# Patient Record
Sex: Female | Born: 1950
Health system: Southern US, Community
[De-identification: ages and names within clinical notes are randomized; demographics above are authoritative.]

## PROBLEM LIST (undated history)

## (undated) DIAGNOSIS — E785 Hyperlipidemia, unspecified: Secondary | ICD-10-CM

## (undated) DIAGNOSIS — T7840XA Allergy, unspecified, initial encounter: Secondary | ICD-10-CM

## (undated) DIAGNOSIS — I10 Essential (primary) hypertension: Secondary | ICD-10-CM

## (undated) DIAGNOSIS — D649 Anemia, unspecified: Secondary | ICD-10-CM

## (undated) DIAGNOSIS — E079 Disorder of thyroid, unspecified: Secondary | ICD-10-CM

## (undated) DIAGNOSIS — J309 Allergic rhinitis, unspecified: Secondary | ICD-10-CM

## (undated) HISTORY — PX: COLONOSCOPY: SHX174

## (undated) HISTORY — DX: Hyperlipidemia, unspecified: E78.5

## (undated) HISTORY — DX: Anemia, unspecified: D64.9

## (undated) HISTORY — DX: Allergy, unspecified, initial encounter: T78.40XA

## (undated) HISTORY — DX: Allergic rhinitis, unspecified: J30.9

## (undated) HISTORY — DX: Essential (primary) hypertension: I10

## (undated) HISTORY — PX: UPPER GASTROINTESTINAL ENDOSCOPY: SHX188

## (undated) HISTORY — PX: SPINE SURGERY: SHX786

## (undated) HISTORY — DX: Disorder of thyroid, unspecified: E07.9

---

## 1997-10-23 ENCOUNTER — Other Ambulatory Visit: Admission: RE | Admit: 1997-10-23 | Discharge: 1997-10-23 | Payer: Self-pay | Admitting: Internal Medicine

## 2011-11-11 ENCOUNTER — Ambulatory Visit (INDEPENDENT_AMBULATORY_CARE_PROVIDER_SITE_OTHER): Payer: BC Managed Care – PPO | Admitting: Emergency Medicine

## 2011-11-11 ENCOUNTER — Ambulatory Visit: Payer: BC Managed Care – PPO

## 2011-11-11 DIAGNOSIS — M5410 Radiculopathy, site unspecified: Secondary | ICD-10-CM

## 2011-11-11 DIAGNOSIS — R079 Chest pain, unspecified: Secondary | ICD-10-CM

## 2011-11-11 DIAGNOSIS — M542 Cervicalgia: Secondary | ICD-10-CM

## 2011-11-11 MED ORDER — PREDNISONE 10 MG PO TABS: ORAL_TABLET | ORAL | Status: DC

## 2011-11-11 MED ORDER — HYDROCODONE-ACETAMINOPHEN 5-325 MG PO TABS
1.0000 | ORAL_TABLET | Freq: Four times a day (QID) | ORAL | Status: AC | PRN
Start: 2011-11-11 — End: 2011-11-21

## 2011-11-11 NOTE — Progress Notes (Signed)
  Subjective:    Patient ID: Joan [name], female    DOB: 1951-02-24, 62 y.o.   MRN: [id number]  HPI patient here with onset last night of a sharp shooting pain which starts in her neck and radiates over the anterior portion of her chest. She has a history of neck surgery for disc call she was in the [employer] of this had no problems with her neck for a number of years. She does have some soreness over the posterior portion of the left shoulder but no other symptoms she has not had any true substernal pain she does not feel short of breath she's had no nausea or vomiting associated with this    Review of Systems     Objective:   Physical Exam patient winces at times when she experiences the pain. She is tender over the left superior scapular area she has no weakness of the upper extremities. Her chest is clear to auscultation and percussion. Her cardiac exam is regular rate without murmurs rubs or gallops t   [facility] reading (PRIMARY) by  Dr. Cleta Alberts degenerative changes C3-C4 C5 and C6 are fused       Assessment & Plan:  Patient here with radicular symptoms the her pains are sharp shooting pains lasting only 1-2 seconds. They can occur every 2-3 minutes. I suspect the origin of this pain is her neck. She has had previous neck surgery and this would be the most likely source. X-rays confirm suspicion of a pinched nerve we'll treat with prednisone and pain medication

## 2011-11-11 NOTE — Patient Instructions (Signed)
Cervical Radiculopathy  Cervical radiculopathy means a nerve in the neck is pinched or bruised. This can cause pain or loss of feeling (numbness) that runs from your neck to your arm and fingers.  HOME CARE    Put ice on the injured or painful area.   Put ice in a plastic bag.   Place a towel between your skin and the bag.   Leave the ice on for 15 to 20 minutes, 3 to 4 times a day, or as told by your doctor.   If ice does not help, you can try using heat. Take a warm shower or bath, or use a hot water bottle as told by your doctor.   You may try a gentle neck and shoulder massage.   Use a flat pillow when you sleep.   Only take medicines as told by your doctor.   Keep all physical therapy visits as told by your doctor.   If you are given a soft collar, wear it as told by your doctor.  GET HELP RIGHT AWAY IF:    Your pain gets worse and is not controlled with medicine.   You lose feeling or feel weak in your hand, arm, face, or leg.   You have a fever or stiff neck.   You cannot control when you poop or pee (incontinence).   You have trouble with walking, balance, or speaking.  MAKE SURE YOU:    Understand these instructions.   Will watch your condition.   Will get help right away if you are not doing well or get worse.  Document Released: 06/11/2011 Document Reviewed: 06/08/2011  Exit[employer] Patient Information 2012 [employer].

## 2011-11-18 ENCOUNTER — Ambulatory Visit (INDEPENDENT_AMBULATORY_CARE_PROVIDER_SITE_OTHER): Payer: BC Managed Care – PPO | Admitting: Emergency Medicine

## 2011-11-18 VITALS — BP 116/71 | HR 92 | Temp 98.4°F | Resp 18 | Ht 63.0 in | Wt 182.0 lb

## 2011-11-18 DIAGNOSIS — IMO0002 Reserved for concepts with insufficient information to code with codable children: Secondary | ICD-10-CM

## 2011-11-18 DIAGNOSIS — M542 Cervicalgia: Secondary | ICD-10-CM

## 2011-11-18 NOTE — Progress Notes (Signed)
  Subjective:    Patient ID: Joan [name], female    DOB: 04-30-1951, 61 y.o.   MRN: [id number]  HPI patient enters for followup neck and radicular symptoms. Patient states she is well she has no pain she has no radicular symptoms.    Review of Systems     Objective:   Physical Exam there is full range of motion of the neck deep tendon reflexes are symmetrical motor strength is 5 out of 5 all muscle groups of the upper extremities.        Assessment & Plan:  Assessment is neck pain with cervical radiculopathy resolved. She is to finish out her medications. Noted treatment necessary.

## 2012-10-04 ENCOUNTER — Ambulatory Visit (INDEPENDENT_AMBULATORY_CARE_PROVIDER_SITE_OTHER): Payer: BC Managed Care – PPO | Admitting: Family Medicine

## 2012-10-04 ENCOUNTER — Ambulatory Visit: Payer: BC Managed Care – PPO

## 2012-10-04 VITALS — BP 112/84 | HR 71 | Temp 98.4°F | Resp 18 | Ht 63.0 in | Wt 186.0 lb

## 2012-10-04 DIAGNOSIS — M7050 Other bursitis of knee, unspecified knee: Secondary | ICD-10-CM

## 2012-10-04 DIAGNOSIS — M25561 Pain in right knee: Secondary | ICD-10-CM

## 2012-10-04 DIAGNOSIS — M25569 Pain in unspecified knee: Secondary | ICD-10-CM

## 2012-10-04 MED ORDER — DICLOFENAC SODIUM 75 MG PO TBEC: 75.0000 mg | DELAYED_RELEASE_TABLET | Freq: Two times a day (BID) | ORAL | Status: DC

## 2012-10-04 NOTE — Patient Instructions (Addendum)
Take diclofenac one pill twice daily with food.   I think it is okay to keep exercising, but if it is hurting you stop. Try not to overdo it. Avoid a lot of squatting-type activities.
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
## 2012-10-13 ENCOUNTER — Ambulatory Visit (INDEPENDENT_AMBULATORY_CARE_PROVIDER_SITE_OTHER): Payer: BC Managed Care – PPO | Admitting: Emergency Medicine

## 2012-10-13 VITALS — BP 122/76 | HR 84 | Temp 98.4°F | Resp 16 | Ht 62.5 in | Wt 188.0 lb

## 2012-10-13 DIAGNOSIS — M2390 Unspecified internal derangement of unspecified knee: Secondary | ICD-10-CM

## 2012-10-13 MED ORDER — NAPROXEN SODIUM 550 MG PO TABS: 550.0000 mg | ORAL_TABLET | Freq: Two times a day (BID) | ORAL | Status: DC

## 2012-10-13 NOTE — Patient Instructions (Addendum)
Knee, Cartilage (Meniscus) Injury  It is suspected that you have a torn cartilage (meniscus) in your knee. The menisci are made of tough cartilage, and fit between the surfaces of the thigh and leg bones. The menisci are "C"shaped and have a wedged profile. The wedged profile helps the stability of the joint by keeping the rounded femur surface from sliding off the flat tibial surface. The menisci are fed (nourished) by small blood vessels; but there is also a large area at the inner edge of the meniscus that does not have a good blood supply (avascular). This presents a problem when there is an injury to the meniscus, because areas without good blood supply heal poorly. As a result when there is a torn cartilage in the knee, surgery is often required to fix it. This is usually done with a surgical procedure less invasive than open surgery (arthroscopy). Some times open surgery of the knee is required if there is other damage.  PURPOSE OF THE MENISCUS  The medial meniscus rests on the medial tibial plateau. The tibia is the large bone in your lower leg (the shin bone). The medial tibial plateau is the upper end of the bone making up the inner part of your knee. The lateral meniscus serves the same purpose and is located on the outside of the knee. The menisci help to distribute your body weight across the knee joint; they act as shock absorbers. Without the meniscus present, the weight of your body would be unevenly applied to the bones in your legs (the femur and tibia). The femur is the large bone in your thigh. This uneven weight distribution would cause increased wear and tear on the cartilage lining the joint surfaces, leading to early damage (arthritis) of these areas. The presence of the menisci cartilage is necessary for a healthy knee.  PURPOSE OF THE KNEE CARTILAGE  The knee joint is made up of three bones: the thigh bone (femur), the shin bone (tibia), and the knee cap (patella). The surfaces of these  bones at the knee joint are covered with cartilage called articular cartilage. This smooth slippery surface allows the bones to slide against each other without causing bone damage. The meniscus sits between these cartilaginous surfaces of the bones. It distributes the weight evenly in the joints and helps with the stability of the joint (keeps the joint steady).  HOME CARE INSTRUCTIONS   Use crutches and external braces as instructed.   Once home an ice pack applied to your injured knee may help with discomfort and keep the swelling down. An ice pack can be used for the first couple of days or as instructed.   Only take over-the-counter or prescription medicines for pain, discomfort, or fever as directed by your caregiver.   Call if you do not have relief of pain with medications or if there is increasing in pain.   Call if your foot becomes cold or blue.   You may resume normal diet and activities as directed.   Make sure to keep your appointment with your follow-up caregiver. This injury may require further evaluation and treatment beyond the temporary treatment given today.  Document Released: 09/12/2002 Document Revised: 09/14/2011 Document Reviewed: 01/04/2009  Exit[employer] Patient Information 2013 [employer].

## 2012-10-13 NOTE — Progress Notes (Signed)
[facility] [address] [place] [address] [phone]  Date:  10/13/2012   Name:  Joan [name]   DOB:  March 04, 1951   MRN:  [id number]  PCP:  No primary provider on file.    Chief Complaint: Knee Pain   History of Present Illness:  Joan [name] is a 62 y.o. very pleasant female patient who presents with the following:  No history of injury.  Says that her right knee has been swollen and painful.  Not able to bear weight without pain.  Here previously and to [place] and seen.  No improvement with OTC medication. No improvement with over the counter medications or other home remedies. Denies other complaint or health concern today.   There is no problem list on file for this patient.   Past Medical History  Diagnosis Date  . Hypertension   . Hyperlipidemia   . Thyroid disease     Past Surgical History  Procedure Laterality Date  . Spine surgery      History  Substance Use Topics  . Smoking status: Never Smoker   . Smokeless tobacco: Not on file  . Alcohol Use: No    No family history on file.  Allergies  Allergen Reactions  . Penicillins     Medication list has been reviewed and updated.  Current Outpatient Prescriptions on File Prior to Visit  Medication Sig Dispense Refill  . hydrochlorothiazide (MICROZIDE) 12.5 MG capsule Take 12.5 mg by mouth daily.      Marland Kitchen levothyroxine (SYNTHROID, LEVOTHROID) 137 MCG tablet Take 137 mcg by mouth daily.      . pravastatin (PRAVACHOL) 20 MG tablet Take 20 mg by mouth daily.      . diclofenac (VOLTAREN) 75 MG EC tablet Take 1 tablet (75 mg total) by mouth 2 (two) times daily.  30 tablet  1   No current facility-administered medications on file prior to visit.    Review of Systems:  As per HPI, otherwise negative.    Physical Examination: Filed Vitals:   10/13/12 1235  BP: 122/76  Pulse: 84  Temp: 98.4 F (36.9 C)  Resp: 16   Filed Vitals:   10/13/12 1235  Height: 5' 2.5" (1.588  m)  Weight: 188 lb (85.276 kg)   Body mass index is 33.82 kg/(m^2). Ideal Body Weight: Weight in (lb) to have BMI = 25: 138.6   GEN: WDWN, NAD, Non-toxic, Alert & Oriented x 3 HEENT: Atraumatic, Normocephalic.  Ears and Nose: No external deformity. EXTR: No clubbing/cyanosis/edema NEURO: Normal gait.  PSYCH: Normally interactive. Conversant. Not depressed or anxious appearing.  Calm demeanor.  Knee tender medially. Suggestive of meniscus tear.  Assessment and Plan: Meniscus tear MRI Follow up as needed   Signed,  Pillars Odor, MD

## 2012-10-27 ENCOUNTER — Ambulatory Visit
Admission: RE | Admit: 2012-10-27 | Discharge: 2012-10-27 | Disposition: A | Discharge status: Home / Self Care | Payer: BC Managed Care – PPO | Source: Ambulatory Visit | Attending: Emergency Medicine | Admitting: Emergency Medicine

## 2012-10-27 DIAGNOSIS — M2390 Unspecified internal derangement of unspecified knee: Secondary | ICD-10-CM

## 2012-10-27 NOTE — Addendum Note (Signed)
Addended by: Carmelina Dane on: 10/27/2012 07:26 PM   Modules accepted: Orders

## 2012-11-01 ENCOUNTER — Telehealth: Payer: Self-pay | Admitting: Radiology

## 2012-11-01 NOTE — Telephone Encounter (Signed)
Pt is calling back because she had a missed call and she believe it could be about her last visit Call back number is [phone]

## 2012-11-02 NOTE — Telephone Encounter (Signed)
Was in regards to her MRI scan. Called her. Report in the results folder.

## 2012-11-02 NOTE — Telephone Encounter (Signed)
Pt CB and I gave her Dr Ewell Poe note on MRI results. She stated she has seen ortho and is going back for f/up after MRI on 11/04/12.

## 2012-11-11 ENCOUNTER — Encounter: Payer: Self-pay | Admitting: *Deleted

## 2012-12-30 ENCOUNTER — Ambulatory Visit (INDEPENDENT_AMBULATORY_CARE_PROVIDER_SITE_OTHER): Payer: BC Managed Care – PPO | Admitting: Physician Assistant

## 2012-12-30 ENCOUNTER — Telehealth: Payer: Self-pay

## 2012-12-30 DIAGNOSIS — T6391XA Toxic effect of contact with unspecified venomous animal, accidental (unintentional), initial encounter: Secondary | ICD-10-CM

## 2012-12-30 DIAGNOSIS — L299 Pruritus, unspecified: Secondary | ICD-10-CM

## 2012-12-30 DIAGNOSIS — I10 Essential (primary) hypertension: Secondary | ICD-10-CM | POA: Insufficient documentation

## 2012-12-30 DIAGNOSIS — E039 Hypothyroidism, unspecified: Secondary | ICD-10-CM | POA: Insufficient documentation

## 2012-12-30 DIAGNOSIS — E7800 Pure hypercholesterolemia, unspecified: Secondary | ICD-10-CM | POA: Insufficient documentation

## 2012-12-30 MED ORDER — CETIRIZINE HCL 10 MG PO TABS: 10.0000 mg | ORAL_TABLET | Freq: Every day | ORAL | Status: DC

## 2012-12-30 MED ORDER — RANITIDINE HCL 300 MG PO TABS: 300.0000 mg | ORAL_TABLET | Freq: Every day | ORAL | Status: DC

## 2012-12-30 NOTE — Telephone Encounter (Signed)
Joan [name]   Patient took the medication and is doing much better.  Thank you so much.

## 2012-12-30 NOTE — Progress Notes (Signed)
   [address] [place] [address]   Phone [phone]  Subjective:    Patient ID: Joan [name], female    DOB: 07-24-1950, 62 y.o.   MRN: [id number]  HPI  Pt presents to clinic with a bee sting to her L leg above the posterior fossa of the knee.  She was gardening and felt a sting to the area and this am it was red and swollen and warm to the touch.  She has used no meds for it and done nothing to the area.   Review of Systems  Constitutional: Negative for fever and chills.  Skin: Positive for rash.       Objective:   Physical Exam  Vitals reviewed. Constitutional: She is oriented to person, place, and time. She appears well-developed and well-nourished.  HENT:  Head: Normocephalic and atraumatic.  Right Ear: External ear normal.  Left Ear: External ear normal.  Eyes: Conjunctivae are normal.  Neck: Normal range of motion.  Pulmonary/Chest: Effort normal.  Neurological: She is alert and oriented to person, place, and time.  Skin: Skin is warm and dry. Rash noted. Rash is urticarial (indurated area that is warm to the touch - marked with sharpie - ~6-8 cm in size).  Psychiatric: She has a normal mood and affect. Her behavior is normal. Judgment and thought content normal.          Assessment & Plan:  Bee sting, initial encounter - local allergic reaction pt will also add benadryl today and ice - if the area gets worse - RTC- Plan: cetirizine (ZYRTEC) 10 MG tablet, ranitidine (ZANTAC) 300 MG tablet  Itching - Plan: cetirizine (ZYRTEC) 10 MG tablet, ranitidine (ZANTAC) 300 MG tablet  Benny Lennert PA-C 12/30/2012 8:39 AM

## 2012-12-30 NOTE — Telephone Encounter (Signed)
Great!

## 2013-01-12 ENCOUNTER — Ambulatory Visit (INDEPENDENT_AMBULATORY_CARE_PROVIDER_SITE_OTHER): Payer: BC Managed Care – PPO | Admitting: Physician Assistant

## 2013-01-12 VITALS — BP 144/84 | HR 79 | Temp 98.1°F | Resp 18 | Ht 64.0 in | Wt 193.0 lb

## 2013-01-12 DIAGNOSIS — R05 Cough: Secondary | ICD-10-CM

## 2013-01-12 DIAGNOSIS — J310 Chronic rhinitis: Secondary | ICD-10-CM

## 2013-01-12 DIAGNOSIS — J029 Acute pharyngitis, unspecified: Secondary | ICD-10-CM

## 2013-01-12 LAB — POCT RAPID STREP A (OFFICE): Rapid Strep A Screen: NEGATIVE

## 2013-01-12 MED ORDER — BENZONATATE 100 MG PO CAPS: 100.0000 mg | ORAL_CAPSULE | Freq: Three times a day (TID) | ORAL | Status: DC | PRN

## 2013-01-12 MED ORDER — GUAIFENESIN ER 1200 MG PO TB12: 1.0000 | ORAL_TABLET | Freq: Two times a day (BID) | ORAL | Status: DC | PRN

## 2013-01-12 MED ORDER — IPRATROPIUM BROMIDE 0.03 % NA SOLN: 2.0000 | Freq: Two times a day (BID) | NASAL | Status: DC

## 2013-01-12 NOTE — Progress Notes (Signed)
I have examined this patient along with the student and agree.  

## 2013-01-12 NOTE — Progress Notes (Signed)
  Subjective:    Patient ID: Joan [name], female    DOB: 26-Oct-1950, 62 y.o.   MRN: [id number]  HPI  Presents with sore throat, ear pain x 1.5 weeks, cough & loss of voice x 1 day. Pt reports feeling fine, recovering from R knee meniscus surgery. She went to visit cousin who is a chain smoker and keeps the house very hot for 2 days. When she returned home she developed a sore throat, painful swallowing, throat clearing and L ear pain. Throat pain is worse on L, feels "raw". Cough and loss of voice appeared this morning. Cough is productive with green/yellow sputum. States she blew her nose this morning and instantly felt very dizzy. Complains of mild headache. Denies rhinorrhea, sinus tenderness or nasal congestion. Denies fever.   Review of Systems Denies: SOB, chest pain, abdominal pain, urinary symptoms     Objective:   Physical Exam  BP 144/84  Pulse 79  Temp(Src) 98.1 F (36.7 C) (Oral)  Resp 18  Ht 5\' 4"  (1.626 m)  Wt 193 lb (87.544 kg)  BMI 33.11 kg/m2  SpO2 98%  General: WDWN female, appears stated age, NAD, loss of voice Head: normocephalic, atraumatic, no sinus tenderness Eyes: PERRLA, sclera/conjunctiva clear Ears: TMs clear with visible bony landmarks Nose: turbinates normal, congestion L side Throat: moist mucous membranes, uvula midline, posterior pharynx is erythematous with mild edema no exudate  Neck: supple, no palpable lymphadenopathy  Resp: good air entry, CTA bilaterally no rhonchi rales wheezes Cardiac: RRR, S1S2 appreciated, no murmurs rubs gallops  Neuro: A&O x 3  Skin: no rashes lesions   Results for orders placed in visit on 01/12/13  POCT RAPID STREP A (OFFICE)      Result Value Range   Rapid Strep A Screen Negative  Negative       Assessment & Plan:  Acute pharyngitis - Plan: POCT rapid strep A, Culture, Group A Strep  Cough  Rhinitis  Suspect post nasal drip is causing throat irritation and cough, sinus congestion is causing ear pain.   RTC in 1 week if no improvement.   Meds ordered this encounter  Medications  . benzonatate (TESSALON) 100 MG capsule    Sig: Take 1-2 capsules (100-200 mg total) by mouth 3 (three) times daily as needed for cough.    Dispense:  40 capsule    Refill:  0    Order Specific Question:  Supervising Provider    Answer:  DOOLITTLE, ROBERT P [[id number]]  . ipratropium (ATROVENT) 0.03 % nasal spray    Sig: Place 2 sprays into the nose 2 (two) times daily.    Dispense:  30 mL    Refill:  0    Order Specific Question:  Supervising Provider    Answer:  DOOLITTLE, ROBERT P [[id number]]  . Guaifenesin (MUCINEX MAXIMUM STRENGTH) 1200 MG TB12    Sig: Take 1 tablet (1,200 mg total) by mouth every 12 (twelve) hours as needed.    Dispense:  14 tablet    Refill:  1    Order Specific Question:  Supervising Provider    Answer:  DOOLITTLE, ROBERT P [[id number]]

## 2013-01-12 NOTE — Patient Instructions (Signed)
Get plenty of rest and drink at least 64 ounces of water daily. If your symptoms have not improved in the next 7 days, please contact the office.

## 2014-04-03 DIAGNOSIS — Z23 Encounter for immunization: Secondary | ICD-10-CM | POA: Diagnosis not present

## 2014-08-07 ENCOUNTER — Ambulatory Visit (INDEPENDENT_AMBULATORY_CARE_PROVIDER_SITE_OTHER): Payer: Medicare Other | Admitting: Family Medicine

## 2014-08-07 ENCOUNTER — Ambulatory Visit (INDEPENDENT_AMBULATORY_CARE_PROVIDER_SITE_OTHER): Payer: BC Managed Care – PPO

## 2014-08-07 VITALS — BP 122/80 | HR 79 | Temp 98.3°F | Resp 18 | Ht 63.5 in | Wt 192.0 lb

## 2014-08-07 DIAGNOSIS — S93402A Sprain of unspecified ligament of left ankle, initial encounter: Secondary | ICD-10-CM

## 2014-08-07 DIAGNOSIS — M79672 Pain in left foot: Secondary | ICD-10-CM

## 2014-08-07 NOTE — Progress Notes (Addendum)
Subjective:  This chart was scribed for Merri Ray MD,  by Tamsen Roers, at [facility].  This patient was seen in room 12 and the patient's care was started at 9:40 AM.     Patient ID: Joan [name], female    DOB: 03/03/1951, 64 y.o.   MRN: [id number]  HPI  HPI Comments: Joan [name] is a 64 y.o. female who presents to [facility] for left ankle pain which started two days ago. Patient was wearing wedges and as she was going down the steps and had a left foot inversion. Right after the incident, she had no trouble walking but an hour later, she notes she had pain and needed help with ambulation. The last two days she has been icing her foot and elevating it. Patient notes of no other complaints today.      Patient Active Problem List   Diagnosis Date Noted  . HTN (hypertension) 12/30/2012  . Pure hypercholesterolemia 12/30/2012  . Unspecified hypothyroidism 12/30/2012   Past Medical History  Diagnosis Date  . Hypertension   . Hyperlipidemia   . Thyroid disease   . Allergy    Past Surgical History  Procedure Laterality Date  . Spine surgery     Allergies  Allergen Reactions  . Penicillins Hives   Prior to Admission medications   Medication Sig Start Date End Date Taking? Authorizing Provider  hydrochlorothiazide (MICROZIDE) 12.5 MG capsule Take 12.5 mg by mouth daily.   Yes Historical Provider, MD  levothyroxine (SYNTHROID, LEVOTHROID) 137 MCG tablet Take 137 mcg by mouth daily.   Yes Historical Provider, MD  benzonatate (TESSALON) 100 MG capsule Take 1-2 capsules (100-200 mg total) by mouth 3 (three) times daily as needed for cough. Patient not taking: Reported on 08/07/2014 01/12/13   Chelle S Jeffery, PA-C  cetirizine (ZYRTEC) 10 MG tablet Take 1 tablet (10 mg total) by mouth daily. Patient not taking: Reported on 08/07/2014 12/30/12   Mancel Bale, PA-C  Guaifenesin [facility] MAXIMUM STRENGTH) 1200 MG TB12 Take 1  tablet (1,200 mg total) by mouth every 12 (twelve) hours as needed. Patient not taking: Reported on 08/07/2014 01/12/13   Chelle S Jeffery, PA-C  ipratropium (ATROVENT) 0.03 % nasal spray Place 2 sprays into the nose 2 (two) times daily. Patient not taking: Reported on 08/07/2014 01/12/13   Chelle S Jeffery, PA-C  pravastatin (PRAVACHOL) 20 MG tablet Take 20 mg by mouth daily.    Historical Provider, MD  ranitidine (ZANTAC) 300 MG tablet Take 1 tablet (300 mg total) by mouth at bedtime. Patient not taking: Reported on 08/07/2014 12/30/12   Mancel Bale, PA-C   History   Social History  . Marital Status: Married    Spouse Name: N/A    Number of Children: N/A  . Years of Education: N/A   Occupational History  . Not on file.   Social History Main Topics  . Smoking status: Never Smoker   . Smokeless tobacco: Not on file  . Alcohol Use: No  . Drug Use: No  . Sexual Activity: Not on file   Other Topics Concern  . Not on file   Social History Narrative        Review of Systems  Constitutional: Negative for fever.  Musculoskeletal: Positive for myalgias.  Skin: Negative for color change.       Objective:   Physical Exam  Constitutional: She is oriented to person, place, and time. She appears well-developed and  well-nourished. No distress.  HENT:  [name]: Normocephalic and atraumatic.  Eyes: Conjunctivae and EOM are normal.  Neck: Neck supple.  Cardiovascular: Normal rate.   Pulmonary/Chest: Effort normal. No respiratory distress.  Musculoskeletal: Normal range of motion.  Left knee: full ROM, no tenderness specifically none at proximal fibula.   Left ankle: Negative squeeze. negative Kleiger  achilles is non tender.  Normal ROM.   Ankle is non tender specifically the  malleoli  There is some soft tissue swelling over the ATF but non tender.     left foot: some tenderness along the proxial  fifth metatarsal but overlying skin is not swollen no ecchymosis.    remainder of  foot is non tender.  Negative drawer and negative talar tilt  Neurological: She is alert and oriented to person, place, and time.  Skin: Skin is warm and dry.  Psychiatric: She has a normal mood and affect. Her behavior is normal.  Nursing note and vitals reviewed.   Filed Vitals:   08/07/14 0910  BP: 122/80  Pulse: 79  Temp: 98.3 F (36.8 C)  TempSrc: Oral  Resp: 18  Height: 5' 3.5" (1.613 m)  Weight: 192 lb (87.091 kg)  SpO2: 98%     [facility] (PRIMARY) x-ray report read by Dr.Satsuki Zillmer  left foot:  No apparent fracture or acute findings.      Assessment & Plan:   Joan [name] is a 64 y.o. female Left foot pain - Plan: DG Foot Complete Left  Left ankle sprain, initial encounter - Plan: DG Foot Complete Left  Mild/grade 1 sprain, lateral left ankle. Lateral foot pain may be from peroneal insertion, but no fracture seen and FROM.   -sweedo brace fitted, ankle rom and HEP by handout, rtc precautions discussed.   No orders of the defined types were placed in this encounter.   Patient Instructions  Your xray did not appear to have a fracture.  You can wear the wrap up ankle brace as needed, but come out of it for stretches and range of motion.  Ok to walk for exercise, and other rehab exercises below.  Return to the clinic or go to the nearest emergency room if any of your symptoms worsen or new symptoms occur.  Acute Ankle Sprain with Phase I Rehab An acute ankle sprain is a partial or complete tear in one or more of the ligaments of the ankle due to traumatic injury. The severity of the injury depends on both the number of ligaments sprained and the grade of sprain. There are 3 grades of sprains.   A grade 1 sprain is a mild sprain. There is a slight pull without obvious tearing. There is no loss of strength, and the muscle and ligament are the correct length.  A grade 2 sprain is a moderate sprain. There is tearing of fibers within the substance of the ligament where it  connects two bones or two cartilages. The length of the ligament is increased, and there is usually decreased strength.  A grade 3 sprain is a complete rupture of the ligament and is uncommon. In addition to the grade of sprain, there are three types of ankle sprains.  Lateral ankle sprains: This is a sprain of one or more of the three ligaments on the outer side (lateral) of the ankle. These are the most common sprains. Medial ankle sprains: There is one large triangular ligament of the inner side (medial) of the ankle that is susceptible to injury. Medial ankle sprains are  less common. Syndesmosis, "high ankle," sprains: The syndesmosis is the ligament that connects the two bones of the lower leg. Syndesmosis sprains usually only occur with very severe ankle sprains. SYMPTOMS  Pain, tenderness, and swelling in the ankle, starting at the side of injury that may progress to the whole ankle and foot with time.  "Pop" or tearing sensation at the time of injury.  Bruising that may spread to the heel.  Impaired ability to walk soon after injury. CAUSES   Acute ankle sprains are caused by trauma placed on the ankle that temporarily forces or pries the anklebone (talus) out of its normal socket.  Stretching or tearing of the ligaments that normally hold the joint in place (usually due to a twisting injury). RISK INCREASES WITH:  Previous ankle sprain.  Sports in which the foot may land awkwardly (i.e., basketball, volleyball, or soccer) or walking or running on uneven or rough surfaces.  Shoes with inadequate support to prevent sideways motion when stress occurs.  Poor strength and flexibility.  Poor balance skills.  Contact sports. PREVENTION   Warm up and stretch properly before activity.  Maintain physical fitness:  Ankle and leg flexibility, muscle strength, and endurance.  Cardiovascular fitness.  Balance training activities.  Use proper technique and have a coach correct  improper technique.  Taping, protective strapping, bracing, or high-top tennis shoes may help prevent injury. Initially, tape is best; however, it loses most of its support function within 10 to 15 minutes.  Wear proper-fitted protective shoes (High-top shoes with taping or bracing is more effective than either alone).  Provide the ankle with support during sports and practice activities for 12 months following injury. PROGNOSIS   If treated properly, ankle sprains can be expected to recover completely; however, the length of recovery depends on the degree of injury.  A grade 1 sprain usually heals enough in 5 to 7 days to allow modified activity and requires an average of 6 weeks to heal completely.  A grade 2 sprain requires 6 to 10 weeks to heal completely.  A grade 3 sprain requires 12 to 16 weeks to heal.  A syndesmosis sprain often takes more than 3 months to heal. RELATED COMPLICATIONS   Frequent recurrence of symptoms may result in a chronic problem. Appropriately addressing the problem the first time decreases the frequency of recurrence and optimizes healing time. Severity of the initial sprain does not predict the likelihood of later instability.  Injury to other structures (bone, cartilage, or tendon).  A chronically unstable or arthritic ankle joint is a possibility with repeated sprains. TREATMENT Treatment initially involves the use of ice, medication, and compression bandages to help reduce pain and inflammation. Ankle sprains are usually immobilized in a walking cast or boot to allow for healing. Crutches may be recommended to reduce pressure on the injury. After immobilization, strengthening and stretching exercises may be necessary to regain strength and a full range of motion. Surgery is rarely needed to treat ankle sprains. MEDICATION   Nonsteroidal anti-inflammatory medications, such as aspirin and ibuprofen (do not take for the first 3 days after injury or within 7  days before surgery), or other minor pain relievers, such as acetaminophen, are often recommended. Take these as directed by your caregiver. Contact your caregiver immediately if any bleeding, stomach upset, or signs of an allergic reaction occur from these medications.  Ointments applied to the skin may be helpful.  Pain relievers may be prescribed as necessary by your caregiver. Do not take  prescription pain medication for longer than 4 to 7 days. Use only as directed and only as much as you need. HEAT AND COLD  Cold treatment (icing) is used to relieve pain and reduce inflammation for acute and chronic cases. Cold should be applied for 10 to 15 minutes every 2 to 3 hours for inflammation and pain and immediately after any activity that aggravates your symptoms. Use ice packs or an ice massage.  Heat treatment may be used before performing stretching and strengthening activities prescribed by your caregiver. Use a heat pack or a warm soak. SEEK IMMEDIATE MEDICAL CARE IF:   Pain, swelling, or bruising worsens despite treatment.  You experience pain, numbness, discoloration, or coldness in the foot or toes.  New, unexplained symptoms develop (drugs used in treatment may produce side effects.) EXERCISES  PHASE I EXERCISES RANGE OF MOTION (ROM) AND STRETCHING EXERCISES - Ankle Sprain, Acute Phase I, Weeks 1 to 2 These exercises may help you when beginning to restore flexibility in your ankle. You will likely work on these exercises for the 1 to 2 weeks after your injury. Once your physician, physical therapist, or athletic trainer sees adequate progress, he or she will advance your exercises. While completing these exercises, remember:   Restoring tissue flexibility helps normal motion to return to the joints. This allows healthier, less painful movement and activity.  An effective stretch should be held for at least 30 seconds.  A stretch should never be painful. You should only feel a  gentle lengthening or release in the stretched tissue. RANGE OF MOTION - Dorsi/Plantar Flexion  While sitting with your right / left knee straight, draw the top of your foot upwards by flexing your ankle. Then reverse the motion, pointing your toes downward.  Hold each position for __________ seconds.  After completing your first set of exercises, repeat this exercise with your knee bent. Repeat __________ times. Complete this exercise __________ times per day.  RANGE OF MOTION - Ankle Alphabet  Imagine your right / left big toe is a pen.  Keeping your hip and knee still, write out the entire alphabet with your "pen." Make the letters as large as you can without increasing any discomfort. Repeat __________ times. Complete this exercise __________ times per day.  STRENGTHENING EXERCISES - Ankle Sprain, Acute -Phase I, Weeks 1 to 2 These exercises may help you when beginning to restore strength in your ankle. You will likely work on these exercises for 1 to 2 weeks after your injury. Once your physician, physical therapist, or athletic trainer sees adequate progress, he or she will advance your exercises. While completing these exercises, remember:   Muscles can gain both the endurance and the strength needed for everyday activities through controlled exercises.  Complete these exercises as instructed by your physician, physical therapist, or athletic trainer. Progress the resistance and repetitions only as guided.  You may experience muscle soreness or fatigue, but the pain or discomfort you are trying to eliminate should never worsen during these exercises. If this pain does worsen, stop and make certain you are following the directions exactly. If the pain is still present after adjustments, discontinue the exercise until you can discuss the trouble with your clinician. STRENGTH - Dorsiflexors  Secure a rubber exercise band/tubing to a fixed object (i.e., table, pole) and loop the other end  around your right / left foot.  Sit on the floor facing the fixed object. The band/tubing should be slightly tense when your foot is relaxed.  Slowly draw your foot back toward you using your ankle and toes.  Hold this position for __________ seconds. Slowly release the tension in the band and return your foot to the starting position. Repeat __________ times. Complete this exercise __________ times per day.  STRENGTH - Plantar-flexors   Sit with your right / left leg extended. Holding onto both ends of a rubber exercise band/tubing, loop it around the ball of your foot. Keep a slight tension in the band.  Slowly push your toes away from you, pointing them downward.  Hold this position for __________ seconds. Return slowly, controlling the tension in the band/tubing. Repeat __________ times. Complete this exercise __________ times per day.  STRENGTH - Ankle Eversion  Secure one end of a rubber exercise band/tubing to a fixed object (table, pole). Loop the other end around your foot just before your toes.  Place your fists between your knees. This will focus your strengthening at your ankle.  Drawing the band/tubing across your opposite foot, slowly, pull your little toe out and up. Make sure the band/tubing is positioned to resist the entire motion.  Hold this position for __________ seconds. Have your muscles resist the band/tubing as it slowly pulls your foot back to the starting position.  Repeat __________ times. Complete this exercise __________ times per day.  STRENGTH - Ankle Inversion  Secure one end of a rubber exercise band/tubing to a fixed object (table, pole). Loop the other end around your foot just before your toes.  Place your fists between your knees. This will focus your strengthening at your ankle.  Slowly, pull your big toe up and in, making sure the band/tubing is positioned to resist the entire motion.  Hold this position for __________ seconds.  Have your  muscles resist the band/tubing as it slowly pulls your foot back to the starting position. Repeat __________ times. Complete this exercises __________ times per day.  STRENGTH - Towel Curls  Sit in a chair positioned on a non-carpeted surface.  Place your right / left foot on a towel, keeping your heel on the floor.  Pull the towel toward your heel by only curling your toes. Keep your heel on the floor.  If instructed by your physician, physical therapist, or athletic trainer, add weight to the end of the towel. Repeat __________ times. Complete this exercise __________ times per day. Document Released: 01/21/2005 Document Revised: 11/06/2013 Document Reviewed: 10/04/2008 [facility] Patient Information 2015 [place], [place]. This information is not intended to replace advice given to you by your health care provider. Make sure you discuss any questions you have with your health care provider.     I personally performed the services described in this documentation, which was scribed in my presence. The recorded information has been reviewed and considered, and addended by me as needed.

## 2014-08-07 NOTE — Patient Instructions (Signed)
Your xray did not appear to have a fracture.  You can wear the wrap up ankle brace as needed, but come out of it for stretches and range of motion.  Ok to walk for exercise, and other rehab exercises below.  Return to the clinic or go to the nearest emergency room if any of your symptoms worsen or new symptoms occur.  Acute Ankle Sprain with Phase I Rehab An acute ankle sprain is a partial or complete tear in one or more of the ligaments of the ankle due to traumatic injury. The severity of the injury depends on both the number of ligaments sprained and the grade of sprain. There are 3 grades of sprains.   A grade 1 sprain is a mild sprain. There is a slight pull without obvious tearing. There is no loss of strength, and the muscle and ligament are the correct length.  A grade 2 sprain is a moderate sprain. There is tearing of fibers within the substance of the ligament where it connects two bones or two cartilages. The length of the ligament is increased, and there is usually decreased strength.  A grade 3 sprain is a complete rupture of the ligament and is uncommon. In addition to the grade of sprain, there are three types of ankle sprains.  Lateral ankle sprains: This is a sprain of one or more of the three ligaments on the outer side (lateral) of the ankle. These are the most common sprains. Medial ankle sprains: There is one large triangular ligament of the inner side (medial) of the ankle that is susceptible to injury. Medial ankle sprains are less common. Syndesmosis, "high ankle," sprains: The syndesmosis is the ligament that connects the two bones of the lower leg. Syndesmosis sprains usually only occur with very severe ankle sprains. SYMPTOMS  Pain, tenderness, and swelling in the ankle, starting at the side of injury that may progress to the whole ankle and foot with time.  "Pop" or tearing sensation at the time of injury.  Bruising that may spread to the heel.  Impaired ability  to walk soon after injury. CAUSES   Acute ankle sprains are caused by trauma placed on the ankle that temporarily forces or pries the anklebone (talus) out of its normal socket.  Stretching or tearing of the ligaments that normally hold the joint in place (usually due to a twisting injury). RISK INCREASES WITH:  Previous ankle sprain.  Sports in which the foot may land awkwardly (i.e., basketball, volleyball, or soccer) or walking or running on uneven or rough surfaces.  Shoes with inadequate support to prevent sideways motion when stress occurs.  Poor strength and flexibility.  Poor balance skills.  Contact sports. PREVENTION   Warm up and stretch properly before activity.  Maintain physical fitness:  Ankle and leg flexibility, muscle strength, and endurance.  Cardiovascular fitness.  Balance training activities.  Use proper technique and have a coach correct improper technique.  Taping, protective strapping, bracing, or high-top tennis shoes may help prevent injury. Initially, tape is best; however, it loses most of its support function within 10 to 15 minutes.  Wear proper-fitted protective shoes (High-top shoes with taping or bracing is more effective than either alone).  Provide the ankle with support during sports and practice activities for 12 months following injury. PROGNOSIS   If treated properly, ankle sprains can be expected to recover completely; however, the length of recovery depends on the degree of injury.  A grade 1 sprain usually heals enough  in 5 to 7 days to allow modified activity and requires an average of 6 weeks to heal completely.  A grade 2 sprain requires 6 to 10 weeks to heal completely.  A grade 3 sprain requires 12 to 16 weeks to heal.  A syndesmosis sprain often takes more than 3 months to heal. RELATED COMPLICATIONS   Frequent recurrence of symptoms may result in a chronic problem. Appropriately addressing the problem the first time  decreases the frequency of recurrence and optimizes healing time. Severity of the initial sprain does not predict the likelihood of later instability.  Injury to other structures (bone, cartilage, or tendon).  A chronically unstable or arthritic ankle joint is a possibility with repeated sprains. TREATMENT Treatment initially involves the use of ice, medication, and compression bandages to help reduce pain and inflammation. Ankle sprains are usually immobilized in a walking cast or boot to allow for healing. Crutches may be recommended to reduce pressure on the injury. After immobilization, strengthening and stretching exercises may be necessary to regain strength and a full range of motion. Surgery is rarely needed to treat ankle sprains. MEDICATION   Nonsteroidal anti-inflammatory medications, such as aspirin and ibuprofen (do not take for the first 3 days after injury or within 7 days before surgery), or other minor pain relievers, such as acetaminophen, are often recommended. Take these as directed by your caregiver. Contact your caregiver immediately if any bleeding, stomach upset, or signs of an allergic reaction occur from these medications.  Ointments applied to the skin may be helpful.  Pain relievers may be prescribed as necessary by your caregiver. Do not take prescription pain medication for longer than 4 to 7 days. Use only as directed and only as much as you need. HEAT AND COLD  Cold treatment (icing) is used to relieve pain and reduce inflammation for acute and chronic cases. Cold should be applied for 10 to 15 minutes every 2 to 3 hours for inflammation and pain and immediately after any activity that aggravates your symptoms. Use ice packs or an ice massage.  Heat treatment may be used before performing stretching and strengthening activities prescribed by your caregiver. Use a heat pack or a warm soak. SEEK IMMEDIATE MEDICAL CARE IF:   Pain, swelling, or bruising worsens  despite treatment.  You experience pain, numbness, discoloration, or coldness in the foot or toes.  New, unexplained symptoms develop (drugs used in treatment may produce side effects.) EXERCISES  PHASE I EXERCISES RANGE OF MOTION (ROM) AND STRETCHING EXERCISES - Ankle Sprain, Acute Phase I, Weeks 1 to 2 These exercises may help you when beginning to restore flexibility in your ankle. You will likely work on these exercises for the 1 to 2 weeks after your injury. Once your physician, physical therapist, or athletic trainer sees adequate progress, he or she will advance your exercises. While completing these exercises, remember:   Restoring tissue flexibility helps normal motion to return to the joints. This allows healthier, less painful movement and activity.  An effective stretch should be held for at least 30 seconds.  A stretch should never be painful. You should only feel a gentle lengthening or release in the stretched tissue. RANGE OF MOTION - Dorsi/Plantar Flexion  While sitting with your right / left knee straight, draw the top of your foot upwards by flexing your ankle. Then reverse the motion, pointing your toes downward.  Hold each position for __________ seconds.  After completing your first set of exercises, repeat this exercise with  your knee bent. Repeat __________ times. Complete this exercise __________ times per day.  RANGE OF MOTION - Ankle Alphabet  Imagine your right / left big toe is a pen.  Keeping your hip and knee still, write out the entire alphabet with your "pen." Make the letters as large as you can without increasing any discomfort. Repeat __________ times. Complete this exercise __________ times per day.  STRENGTHENING EXERCISES - Ankle Sprain, Acute -Phase I, Weeks 1 to 2 These exercises may help you when beginning to restore strength in your ankle. You will likely work on these exercises for 1 to 2 weeks after your injury. Once your physician, physical  therapist, or athletic trainer sees adequate progress, he or she will advance your exercises. While completing these exercises, remember:   Muscles can gain both the endurance and the strength needed for everyday activities through controlled exercises.  Complete these exercises as instructed by your physician, physical therapist, or athletic trainer. Progress the resistance and repetitions only as guided.  You may experience muscle soreness or fatigue, but the pain or discomfort you are trying to eliminate should never worsen during these exercises. If this pain does worsen, stop and make certain you are following the directions exactly. If the pain is still present after adjustments, discontinue the exercise until you can discuss the trouble with your clinician. STRENGTH - Dorsiflexors  Secure a rubber exercise band/tubing to a fixed object (i.e., table, pole) and loop the other end around your right / left foot.  Sit on the floor facing the fixed object. The band/tubing should be slightly tense when your foot is relaxed.  Slowly draw your foot back toward you using your ankle and toes.  Hold this position for __________ seconds. Slowly release the tension in the band and return your foot to the starting position. Repeat __________ times. Complete this exercise __________ times per day.  STRENGTH - Plantar-flexors   Sit with your right / left leg extended. Holding onto both ends of a rubber exercise band/tubing, loop it around the ball of your foot. Keep a slight tension in the band.  Slowly push your toes away from you, pointing them downward.  Hold this position for __________ seconds. Return slowly, controlling the tension in the band/tubing. Repeat __________ times. Complete this exercise __________ times per day.  STRENGTH - Ankle Eversion  Secure one end of a rubber exercise band/tubing to a fixed object (table, pole). Loop the other end around your foot just before your  toes.  Place your fists between your knees. This will focus your strengthening at your ankle.  Drawing the band/tubing across your opposite foot, slowly, pull your little toe out and up. Make sure the band/tubing is positioned to resist the entire motion.  Hold this position for __________ seconds. Have your muscles resist the band/tubing as it slowly pulls your foot back to the starting position.  Repeat __________ times. Complete this exercise __________ times per day.  STRENGTH - Ankle Inversion  Secure one end of a rubber exercise band/tubing to a fixed object (table, pole). Loop the other end around your foot just before your toes.  Place your fists between your knees. This will focus your strengthening at your ankle.  Slowly, pull your big toe up and in, making sure the band/tubing is positioned to resist the entire motion.  Hold this position for __________ seconds.  Have your muscles resist the band/tubing as it slowly pulls your foot back to the starting position. Repeat __________  times. Complete this exercises __________ times per day.  STRENGTH - Towel Curls  Sit in a chair positioned on a non-carpeted surface.  Place your right / left foot on a towel, keeping your heel on the floor.  Pull the towel toward your heel by only curling your toes. Keep your heel on the floor.  If instructed by your physician, physical therapist, or athletic trainer, add weight to the end of the towel. Repeat __________ times. Complete this exercise __________ times per day. Document Released: 01/21/2005 Document Revised: 11/06/2013 Document Reviewed: 10/04/2008 [facility] Patient Information 2015 [place], [place]. This information is not intended to replace advice given to you by your health care provider. Make sure you discuss any questions you have with your health care provider.

## 2014-08-15 ENCOUNTER — Ambulatory Visit (INDEPENDENT_AMBULATORY_CARE_PROVIDER_SITE_OTHER): Payer: Medicare Other | Admitting: Emergency Medicine

## 2014-08-15 VITALS — BP 124/78 | HR 75 | Temp 98.5°F | Resp 18 | Ht 65.0 in | Wt 192.0 lb

## 2014-08-15 DIAGNOSIS — Z0000 Encounter for general adult medical examination without abnormal findings: Secondary | ICD-10-CM

## 2014-08-15 NOTE — Progress Notes (Addendum)
   Subjective:    Patient ID: Joan [name], female    DOB: [birth year], 64 y.o.   MRN: [id number] This chart was scribed for Arlyss Queen, MD by Marti Sleigh, Medical Scribe. This patient was seen in Room 3 and the patient's care was started at 11:35 AM.  Chief Complaint  Patient presents with  . Employment Physical    has form     HPI HPI Comments: Joan [name] is a 64 y.o. female with a hx of HTN, HLD, and hypothyroidism who presents to [facility] reporting for an employment physical. Pt has not had any issues with hearing or vision. Pt states she is UTD on all her immunizations. 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 She does have a history of hypertension high cholesterol and is on thyroid replacement.    Review of Systems  Constitutional: Negative for fever and chills.  Cardiovascular: Negative for chest pain.  Gastrointestinal: Negative for abdominal pain.  Musculoskeletal: Negative for back pain.       Objective:   Physical Exam  Constitutional: She is oriented to person, place, and time. She appears well-developed and well-nourished.  HENT:  [name]: Normocephalic and atraumatic.  Eyes: Pupils are equal, round, and reactive to light.  Neck: Neck supple.  Cardiovascular: Normal rate and regular rhythm.   Pulmonary/Chest: Effort normal and breath sounds normal. No respiratory distress.  Neurological: She is alert and oriented to person, place, and time.  Skin: Skin is warm and dry.  Psychiatric: She has a normal mood and affect. Her behavior is normal.  Nursing note and vitals reviewed.      Assessment & Plan:  Patient's examination office is normal. She receives her immunizations screening procedures through the [facility] hospital. 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 I personally performed the services described in this documentation, which was scribed in my presence. The recorded  information has been reviewed and is accurate.

## 2014-08-15 NOTE — Progress Notes (Signed)
  Tuberculosis Risk Questionnaire  1. No Were you born outside the [place] in one of the following parts of the world: [place], [place], [place], [place] or [place]?    2. Yes  Have you traveled outside the [place] and lived for more than one month in one of the following parts of the world: [place], [place], [place], [place] or [place]?    3. No Do you have a compromised immune system such as from any of the following conditions:HIV/AIDS, organ or bone marrow transplantation, diabetes, immunosuppressive medicines (e.g. Prednisone, Remicaide), leukemia, lymphoma, cancer of the head or neck, gastrectomy or jejunal bypass, end-stage renal disease (on dialysis), or silicosis?     4. Yes  Have you ever or do you plan on working in: a residential care center, a health care facility, a jail or prison or homeless shelter?    5. No Have you ever: injected illegal drugs, used crack cocaine, lived in a homeless shelter  or been in jail or prison?     6. No Have you ever been exposed to anyone with infectious tuberculosis?    Tuberculosis Symptom Questionnaire  Do you currently have any of the following symptoms?  1. No Unexplained cough lasting more than 3 weeks?   2. No Unexplained fever lasting more than 3 weeks.   3. No Night Sweats (sweating that leaves the bedclothes and sheets wet)     4. No Shortness of Breath   5. No Chest Pain   6. No Unintentional weight loss    7. No Unexplained fatigue (very tired for no reason)

## 2015-03-06 ENCOUNTER — Ambulatory Visit (INDEPENDENT_AMBULATORY_CARE_PROVIDER_SITE_OTHER): Payer: Medicare Other | Admitting: Family Medicine

## 2015-03-06 ENCOUNTER — Other Ambulatory Visit: Payer: Self-pay | Admitting: Family Medicine

## 2015-03-06 VITALS — BP 128/88 | HR 76 | Temp 98.8°F | Resp 18 | Ht 65.0 in | Wt 192.4 lb

## 2015-03-06 DIAGNOSIS — D509 Iron deficiency anemia, unspecified: Secondary | ICD-10-CM | POA: Diagnosis not present

## 2015-03-06 DIAGNOSIS — M25532 Pain in left wrist: Secondary | ICD-10-CM | POA: Diagnosis not present

## 2015-03-06 LAB — POCT CBC
GRANULOCYTE PERCENT: 40.2 % (ref 37–80)
HCT, POC: 35.7 % — AB (ref 37.7–47.9)
Hemoglobin: 10.9 g/dL — AB (ref 12.2–16.2)
LYMPH, POC: 3.2 (ref 0.6–3.4)
MCH, POC: 22.6 pg — AB (ref 27–31.2)
MCHC: 30.6 g/dL — AB (ref 31.8–35.4)
MCV: 73.8 fL — AB (ref 80–97)
MID (CBC): 0.4 (ref 0–0.9)
MPV: 7.7 fL (ref 0–99.8)
PLATELET COUNT, POC: 328 10*3/uL (ref 142–424)
POC Granulocyte: 2.5 (ref 2–6.9)
POC LYMPH %: 53.1 % — AB (ref 10–50)
POC MID %: 6.7 %M (ref 0–12)
RBC: 4.83 M/uL (ref 4.04–5.48)
RDW, POC: 14.7 %
WBC: 6.1 10*3/uL (ref 4.6–10.2)

## 2015-03-06 LAB — URIC ACID: URIC ACID, SERUM: 7.3 mg/dL — AB (ref 2.4–7.0)

## 2015-03-06 MED ORDER — INDOMETHACIN 50 MG PO CAPS: 50.0000 mg | ORAL_CAPSULE | Freq: Three times a day (TID) | ORAL | Status: DC

## 2015-03-06 NOTE — Patient Instructions (Signed)
We are going to treat you for gout or a wrist strain (tendonitis) with the indomethacin medication and splint as needed Also use ice when you can Let me know if you are not improving soon- however if not better or if getting worse please [place] I will be in touch with the rest of your labs- these will help [place] find out if you have gout or not  Certain foods can exacerbate gout- see belowGout Gout is an inflammatory arthritis caused by a buildup of uric acid crystals in the joints. Uric acid is a chemical that is normally present in the blood. When the level of uric acid in the blood is too high it can form crystals that deposit in your joints and tissues. This causes joint redness, soreness, and swelling (inflammation). Repeat attacks are common. Over time, uric acid crystals can form into masses (tophi) near a joint, destroying bone and causing disfigurement. Gout is treatable and often preventable. CAUSES  The disease begins with elevated levels of uric acid in the blood. Uric acid is produced by your body when it breaks down a naturally found substance called purines. Certain foods you eat, such as meats and fish, contain high amounts of purines. Causes of an elevated uric acid level include:  Being passed down from parent to child (heredity).  Diseases that cause increased uric acid production (such as obesity, psoriasis, and certain cancers).  Excessive alcohol use.  Diet, especially diets rich in meat and seafood.  Medicines, including certain cancer-fighting medicines (chemotherapy), water pills (diuretics), and aspirin.  Chronic kidney disease. The kidneys are no longer able to remove uric acid well.  Problems with metabolism. Conditions strongly associated with gout include:  Obesity.  High blood pressure.  High cholesterol.  Diabetes. Not everyone with elevated uric acid levels gets gout. It is not understood why some people get gout and others do not.  Surgery, joint injury, and eating too much of certain foods are some of the factors that can lead to gout attacks. SYMPTOMS   An attack of gout comes on quickly. It causes intense pain with redness, swelling, and warmth in a joint.  Fever can occur.  Often, only one joint is involved. Certain joints are more commonly involved:  Base of the big toe.  Knee.  Ankle.  Wrist.  Finger. Without treatment, an attack usually goes away in a few days to weeks. Between attacks, you usually will not have symptoms, which is different from many other forms of arthritis. DIAGNOSIS  Your caregiver will suspect gout based on your symptoms and exam. In some cases, tests may be recommended. The tests may include:  Blood tests.  Urine tests.  X-rays.  Joint fluid exam. This exam requires a needle to remove fluid from the joint (arthrocentesis). Using a microscope, gout is confirmed when uric acid crystals are seen in the joint fluid. TREATMENT  There are two phases to gout treatment: treating the sudden onset (acute) attack and preventing attacks (prophylaxis).  Treatment of an Acute Attack.  Medicines are used. These include anti-inflammatory medicines or steroid medicines.  An injection of steroid medicine into the affected joint is sometimes necessary.  The painful joint is rested. Movement can worsen the arthritis.  You may use warm or cold treatments on painful joints, depending which works best for you.  Treatment to Prevent Attacks.  If you suffer from frequent gout attacks, your caregiver may advise preventive medicine. These medicines are started after the  acute attack subsides. These medicines either help your kidneys eliminate uric acid from your body or decrease your uric acid production. You may need to stay on these medicines for a very long time.  The early phase of treatment with preventive medicine can be associated with an increase in acute gout attacks. For this reason,  during the first few months of treatment, your caregiver may also advise you to take medicines usually used for acute gout treatment. Be sure you understand your caregiver's directions. Your caregiver may make several adjustments to your medicine dose before these medicines are effective.  Discuss dietary treatment with your caregiver or dietitian. Alcohol and drinks high in sugar and fructose and foods such as meat, poultry, and seafood can increase uric acid levels. Your caregiver or dietitian can advise you on drinks and foods that should be limited. HOME CARE INSTRUCTIONS   Do not take aspirin to relieve pain. This raises uric acid levels.  Only take over-the-counter or prescription medicines for pain, discomfort, or fever as directed by your caregiver.  Rest the joint as much as possible. When in bed, keep sheets and blankets off painful areas.  Keep the affected joint raised (elevated).  Apply warm or cold treatments to painful joints. Use of warm or cold treatments depends on which works best for you.  Use crutches if the painful joint is in your leg.  Drink enough fluids to keep your urine clear or pale yellow. This helps your body get rid of uric acid. Limit alcohol, sugary drinks, and fructose drinks.  Follow your dietary instructions. Pay careful attention to the amount of protein you eat. Your daily diet should emphasize fruits, vegetables, whole grains, and fat-free or low-fat milk products. Discuss the use of coffee, vitamin C, and cherries with your caregiver or dietitian. These may be helpful in lowering uric acid levels.  Maintain a healthy body weight. SEEK MEDICAL CARE IF:   You develop diarrhea, vomiting, or any side effects from medicines.  You do not feel better in 24 hours, or you are getting worse. SEEK IMMEDIATE MEDICAL CARE IF:   Your joint becomes suddenly more tender, and you have chills or a fever. MAKE SURE YOU:   Understand these instructions.  Will  watch your condition.  Will get help right away if you are not doing well or get worse. Document Released: 06/19/2000 Document Revised: 11/06/2013 Document Reviewed: 02/03/2012 [facility] Patient Information 2015 [place], [place]. This information is not intended to replace advice given to you by your health care provider. Make sure you discuss any questions you have with your health care provider.

## 2015-03-06 NOTE — Progress Notes (Addendum)
[facility] [address] [place] [address] [phone]  Date:  03/06/2015   Name:  Joan [name]   DOB:  [birth year]   MRN:  [id number]  PCP:  No PCP Per Patient    Chief Complaint: Wrist Injury   History of Present Illness:  Joan [name] is a 64 y.o. 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  Today (wednesday) she continued to have pain so she came in Last night she noted that apperared to be a bruise on her thenar eminence Then this am she had more pain, and wrapped it in a bandage as this feels more comfortable She is trying to go on vacation today- however she came in first to be seen  She has never had this in the past She is right handed Never had gout No fever or other systemic sx  Patient Active Problem List   Diagnosis Date Noted  . HTN (hypertension) 12/30/2012  . Pure hypercholesterolemia 12/30/2012  . Unspecified hypothyroidism 12/30/2012    Past Medical History  Diagnosis Date  . Hypertension   . Hyperlipidemia   . Thyroid disease   . Allergy     Past Surgical History  Procedure Laterality Date  . Spine surgery      Social History  Substance Use Topics  . Smoking status: Never Smoker   . Smokeless tobacco: None  . Alcohol Use: No    Family History  Problem Relation Age of Onset  . Diabetes Sister     Allergies  Allergen Reactions  . Penicillins Hives    Medication list has been reviewed and updated.  Current Outpatient Prescriptions on File Prior to Visit  Medication Sig Dispense Refill  . hydrochlorothiazide (MICROZIDE) 12.5 MG capsule Take 12.5 mg by mouth daily.    Marland Kitchen levothyroxine  (SYNTHROID, LEVOTHROID) 137 MCG tablet Take 137 mcg by mouth daily.    . pravastatin (PRAVACHOL) 20 MG tablet Take 20 mg by mouth daily.     No current facility-administered medications on file prior to visit.    Review of Systems:  As per HPI- otherwise negative.   Physical Examination: Filed Vitals:   03/06/15 1143  BP: 128/88  Pulse: 76  Temp: 98.8 F (37.1 C)  Resp: 18   Filed Vitals:   03/06/15 1143  Height: 5\' 5"  (1.651 m)  Weight: 192 lb 6.4 oz (87.272 kg)   Body mass index is 32.02 kg/(m^2). Ideal Body Weight: Weight in (lb) to have BMI = 25: 149.9  GEN: WDWN, NAD, Non-toxic, A & O x 3, obese, looks well HEENT: Atraumatic, Normocephalic. Neck supple. No masses, No LAD. Ears and Nose: No external deformity. CV: RRR, No M/G/R. No JVD. No thrill. No extra heart sounds. PULM: CTA B, no wheezes, crackles, rhonchi. No retractions. No resp. distress. No accessory muscle use. EXTR: No c/c/e NEURO Normal gait.  PSYCH: Normally interactive. Conversant. Not depressed or anxious appearing.  Calm demeanor.  Left wrist:  She is tender and swollen over the thumb extensor tendons, tenderness with palpation of radial side of write and with finkelstein's maneuver.  The area is warm No wound  noted, no redness  Results for orders placed or performed in visit on 03/06/15  POCT CBC  Result Value Ref Range   WBC 6.1 4.6 - 10.2 K/uL   Lymph, poc 3.2 0.6 - 3.4   POC LYMPH PERCENT 53.1 (A) 10 - 50 %L   MID (cbc) 0.4 0 - 0.9   POC MID % 6.7 0 - 12 %M   POC Granulocyte 2.5 2 - 6.9   Granulocyte percent 40.2 37 - 80 %G   RBC 4.83 4.04 - 5.48 M/uL   Hemoglobin 10.9 (A) 12.2 - 16.2 g/dL   HCT, POC 35.7 (A) 37.7 - 47.9 %   MCV 73.8 (A) 80 - 97 fL   MCH, POC 22.6 (A) 27 - 31.2 pg   MCHC 30.6 (A) 31.8 - 35.4 g/dL   RDW, POC 14.7 %   Platelet Count, POC 328 142 - 424 K/uL   MPV 7.7 0 - 99.8 fL     Assessment and Plan: Left wrist pain - Plan: POCT CBC, Uric Acid, Sedimentation  Rate  Left wrist pain- suspect gout vs tenosynovitis.  Doubt infection but counseled her to seek care if she is not getting better Placed in a spica splint which felt good to her Treat with indomethacin and ice Call her husband at [phone] with labs- she does not know her cell number Signed Lamar Blinks, MD  Called 9/1- to go over labs.  LMOM- gout is likely.  Will send a letter Results for orders placed or performed in visit on 03/06/15  Uric Acid  Result Value Ref Range   Uric Acid, Serum 7.3 (H) 2.4 - 7.0 mg/dL  Sedimentation Rate  Result Value Ref Range   Sed Rate 36 (H) 0 - 30 mm/hr  POCT CBC  Result Value Ref Range   WBC 6.1 4.6 - 10.2 K/uL   Lymph, poc 3.2 0.6 - 3.4   POC LYMPH PERCENT 53.1 (A) 10 - 50 %L   MID (cbc) 0.4 0 - 0.9   POC MID % 6.7 0 - 12 %M   POC Granulocyte 2.5 2 - 6.9   Granulocyte percent 40.2 37 - 80 %G   RBC 4.83 4.04 - 5.48 M/uL   Hemoglobin 10.9 (A) 12.2 - 16.2 g/dL   HCT, POC 35.7 (A) 37.7 - 47.9 %   MCV 73.8 (A) 80 - 97 fL   MCH, POC 22.6 (A) 27 - 31.2 pg   MCHC 30.6 (A) 31.8 - 35.4 g/dL   RDW, POC 14.7 %   Platelet Count, POC 328 142 - 424 K/uL   MPV 7.7 0 - 99.8 fL

## 2015-03-07 ENCOUNTER — Encounter: Payer: Self-pay | Admitting: Family Medicine

## 2015-03-07 LAB — SEDIMENTATION RATE: SED RATE: 36 mm/h — AB (ref 0–30)

## 2015-03-08 ENCOUNTER — Encounter: Payer: Self-pay | Admitting: Family Medicine

## 2015-03-08 LAB — FERRITIN: FERRITIN: 162 ng/mL (ref 10–291)

## 2015-03-17 ENCOUNTER — Telehealth: Payer: Self-pay

## 2015-03-17 DIAGNOSIS — D509 Iron deficiency anemia, unspecified: Secondary | ICD-10-CM

## 2015-03-17 NOTE — Telephone Encounter (Signed)
Patient returned Dr. Serita Grit' call and has questions pertaining to the letters.   [phone]

## 2015-03-20 NOTE — Telephone Encounter (Signed)
Spoke with her on the phone- she would like to come and have a hg electrophoresis.  Will order this for her- she has microcytic anemia but a normal ferritin. She states that she has had anemia for years, had a colonoscopy for this reason but that was about 10 years ago.  Encouraged her to update her colonoscopy soon

## 2015-05-13 DIAGNOSIS — Z23 Encounter for immunization: Secondary | ICD-10-CM | POA: Diagnosis not present

## 2015-06-24 DIAGNOSIS — M1711 Unilateral primary osteoarthritis, right knee: Secondary | ICD-10-CM | POA: Diagnosis not present

## 2015-06-24 DIAGNOSIS — M25561 Pain in right knee: Secondary | ICD-10-CM | POA: Diagnosis not present

## 2015-07-19 DIAGNOSIS — M65342 Trigger finger, left ring finger: Secondary | ICD-10-CM | POA: Diagnosis not present

## 2015-08-19 DIAGNOSIS — M65342 Trigger finger, left ring finger: Secondary | ICD-10-CM | POA: Diagnosis not present

## 2016-03-05 ENCOUNTER — Other Ambulatory Visit: Payer: Self-pay

## 2016-05-08 ENCOUNTER — Ambulatory Visit (INDEPENDENT_AMBULATORY_CARE_PROVIDER_SITE_OTHER): Payer: BC Managed Care – PPO | Admitting: Family Medicine

## 2016-05-08 ENCOUNTER — Ambulatory Visit (INDEPENDENT_AMBULATORY_CARE_PROVIDER_SITE_OTHER): Payer: Medicare Other

## 2016-05-08 VITALS — BP 132/84 | HR 84 | Temp 98.9°F | Resp 18 | Ht 63.0 in | Wt 189.6 lb

## 2016-05-08 DIAGNOSIS — M25552 Pain in left hip: Secondary | ICD-10-CM

## 2016-05-08 DIAGNOSIS — S79912A Unspecified injury of left hip, initial encounter: Secondary | ICD-10-CM | POA: Diagnosis not present

## 2016-05-08 MED ORDER — PREDNISONE 20 MG PO TABS: ORAL_TABLET | ORAL | 0 refills | Status: DC

## 2016-05-08 NOTE — Progress Notes (Signed)
Patient ID: Joan [name], female    DOB: October 06, 1950, 65 y.o.   MRN: [id number]  PCP: No PCP Per Patient  Chief Complaint  Patient presents with  . Hip Pain    Lt Hip pain - injured yesterday    Subjective:   HPI 65 year old female presents for evaluation of  left hip pain following an injury times one day ago.  The patient has previously been seen here at [facility].  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 To avoid stepping on the student  she twisted her left leg abruptly and pulled a muscle in her left mid hip.p  Felt sore initially and she was able to continue working. Later in the evening pain increased and she began limping. The pain was so intense this morning it awakened her from sleep. Describes pain as aching and throbbing. She took Aleve twice today without relief of symptoms.  Social History   Social History  . Marital status: Married    Spouse name: N/A  . Number of children: N/A  . Years of education: N/A   Occupational History  . Not on file.   Social History Main Topics  . Smoking status: Never Smoker  . Smokeless tobacco: Not on file  . Alcohol use No  . Drug use: No  . Sexual activity: Not on file   Other Topics Concern  . Not on file   Social History Narrative  . No narrative on file    Family History  Problem Relation Age of Onset  . Diabetes Sister    Review of Systems  See HPI   Patient Active Problem List   Diagnosis Date Noted  . HTN (hypertension) 12/30/2012  . Pure hypercholesterolemia 12/30/2012  . Unspecified hypothyroidism 12/30/2012     Prior to Admission medications   Medication Sig Start Date End Date Taking? Authorizing Provider  levothyroxine (SYNTHROID, LEVOTHROID) 137 MCG tablet Take 137 mcg by mouth daily.   Yes Historical Provider, MD  pravastatin (PRAVACHOL) 20 MG tablet Take 20 mg by mouth daily.   Yes Historical Provider, MD    hydrochlorothiazide (MICROZIDE) 12.5 MG capsule Take 12.5 mg by mouth daily.    Historical Provider, MD  indomethacin (INDOCIN) 50 MG capsule Take 1 capsule (50 mg total) by mouth 3 (three) times daily with meals. Use as needed for wrist pain Patient not taking: Reported on 05/08/2016 03/06/15   Darreld Mclean, MD    Allergies  Allergen Reactions  . Penicillins Hives       Objective:  Physical Exam  Constitutional: She is oriented to person, place, and time. She appears well-developed and well-nourished.  HENT:  [name]: Normocephalic and atraumatic.  Right Ear: External ear normal.  Left Ear: External ear normal.  Eyes: Conjunctivae are normal. Pupils are equal, round, and reactive to light.  Neck: Normal range of motion.  Cardiovascular: Normal rate.   Pulmonary/Chest: Effort normal and breath sounds normal.  Musculoskeletal:       Left upper leg: She exhibits tenderness and bony tenderness. She exhibits no swelling and no edema.  Unsteady gait as patient is limping with ambulation.  Neurological: She is alert and oriented to person, place, and time.  Skin: Skin is warm and dry.  Psychiatric: She has a normal mood and affect. Her behavior is normal. Judgment and thought content normal.   Vitals:   05/08/16 0843  BP:  132/84  Pulse: 84  Resp: 18  Temp: 98.9 F (37.2 C)     Dg Hip Unilat W Or W/o Pelvis 2-3 Views Left  Result Date: 05/08/2016 CLINICAL DATA:  Left hip injury yesterday EXAM: DG HIP (WITH OR WITHOUT PELVIS) 2-3V LEFT COMPARISON:  None in PACs FINDINGS: The bones are subjectively adequately mineralized. No lytic or blastic pelvic lesion is observed. No acute pelvic fracture is demonstrated. AP and lateral views of the left hip reveal mild symmetric narrowing of the joint space as compared to the right hip. The articular surfaces of the left femoral [name] and acetabulum remain smoothly rounded. The left femoral neck, intertrochanteric, and subtrochanteric regions are  normal. There are pelvic phleboliths. The bowel gas pattern is unremarkable. IMPRESSION: No acute fracture or dislocation of the left hip. Mild symmetric narrowing of the joint consistent with osteoarthritis. No significant bony changes. Electronically Signed   By: David  Martinique M.D.   On: 05/08/2016 09:20   Assessment & Plan:  1. Left hip pain, likely hip strain  - DG HIP UNILAT W OR W/O PELVIS 2-3 VIEWS LEFT; negative for acute injury  Plan: Take Prednisone 20 mg,  in mornings with breakfast as follows:   Take 3 pills for 3 days, Take 2 pills for 3 days, and Take 1 pill for 3 days.  Complete all medication.  May take ylenol for acute pain but avoid taking any ibuprofen or Aleve while taking prednisone.  Carroll Sage. Kenton Kingfisher, MSN, FNP-C [facility]

## 2016-05-08 NOTE — Patient Instructions (Addendum)
Your x-ray was negative of any acute fracture and does show some osteoarthritis changes within the left hip. If pain becomes persistent, it may be necessary to follow-up with an orthopedic specialist for further evaluation.   For left hip strain Take Prednisone 20 mg,  in mornings with breakfast as follows:  Take 3 pills for 3 days, Take 2 pills for 3 days, and Take 1 pill for 3 days.  Complete all medication.  May take ylenol for acute pain but avoid taking any ibuprofen or Aleve while taking prednisone.  IF you received an x-ray today, you will receive an invoice from [facility]. Please contact [facility] at [phone] with questions or concerns regarding your invoice.   IF you received labwork today, you will receive an invoice from [employer]. Please contact [facility] at [phone] with questions or concerns regarding your invoice.   Our billing staff will not be able to assist you with questions regarding bills from these companies.  You will be contacted with the lab results as soon as they are available. The fastest way to get your results is to activate your My Chart account. Instructions are located on the last page of this paperwork. If you have not heard from [place] regarding the results in 2 weeks, please contact this office.     Hip Pain Your hip is the joint between your upper legs and your lower pelvis. The bones, cartilage, tendons, and muscles of your hip joint perform a lot of work each day supporting your body weight and allowing you to move around. Hip pain can range from a minor ache to severe pain in one or both of your hips. Pain may be felt on the inside of the hip joint near the groin, or the outside near the buttocks and upper thigh. You may have swelling or stiffness as well.  HOME CARE INSTRUCTIONS   Take medicines only as directed by your health care provider.  Apply ice to the injured area:  Put ice in a plastic  bag.  Place a towel between your skin and the bag.  Leave the ice on for 15-20 minutes at a time, 3-4 times a day.  Keep your leg raised (elevated) when possible to lessen swelling.  Avoid activities that cause pain.  Follow specific exercises as directed by your health care provider.  Sleep with a pillow between your legs on your most comfortable side.  Record how often you have hip pain, the location of the pain, and what it feels like. SEEK MEDICAL CARE IF:   You are unable to put weight on your leg.  Your hip is red or swollen or very tender to touch.  Your pain or swelling continues or worsens after 1 week.  You have increasing difficulty walking.  You have a fever. SEEK IMMEDIATE MEDICAL CARE IF:   You have fallen.  You have a sudden increase in pain and swelling in your hip. MAKE SURE YOU:   Understand these instructions.  Will watch your condition.  Will get help right away if you are not doing well or get worse.   This information is not intended to replace advice given to you by your health care provider. Make sure you discuss any questions you have with your health care provider.   Document Released: 12/10/2009 Document Revised: 07/13/2014 Document Reviewed: 02/16/2013 Elsevier Interactive Patient Education [employer].

## 2016-07-02 IMAGING — CR DG FOOT COMPLETE 3+V*L*
3 series · 3 of 3 positions shown · non-contrast
Comparison: None.

CLINICAL DATA: Onset of left ankle pain 2 hrs after finversion
injury 2 days ago

EXAM:
LEFT FOOT - COMPLETE 3+ VIEW

[AP]
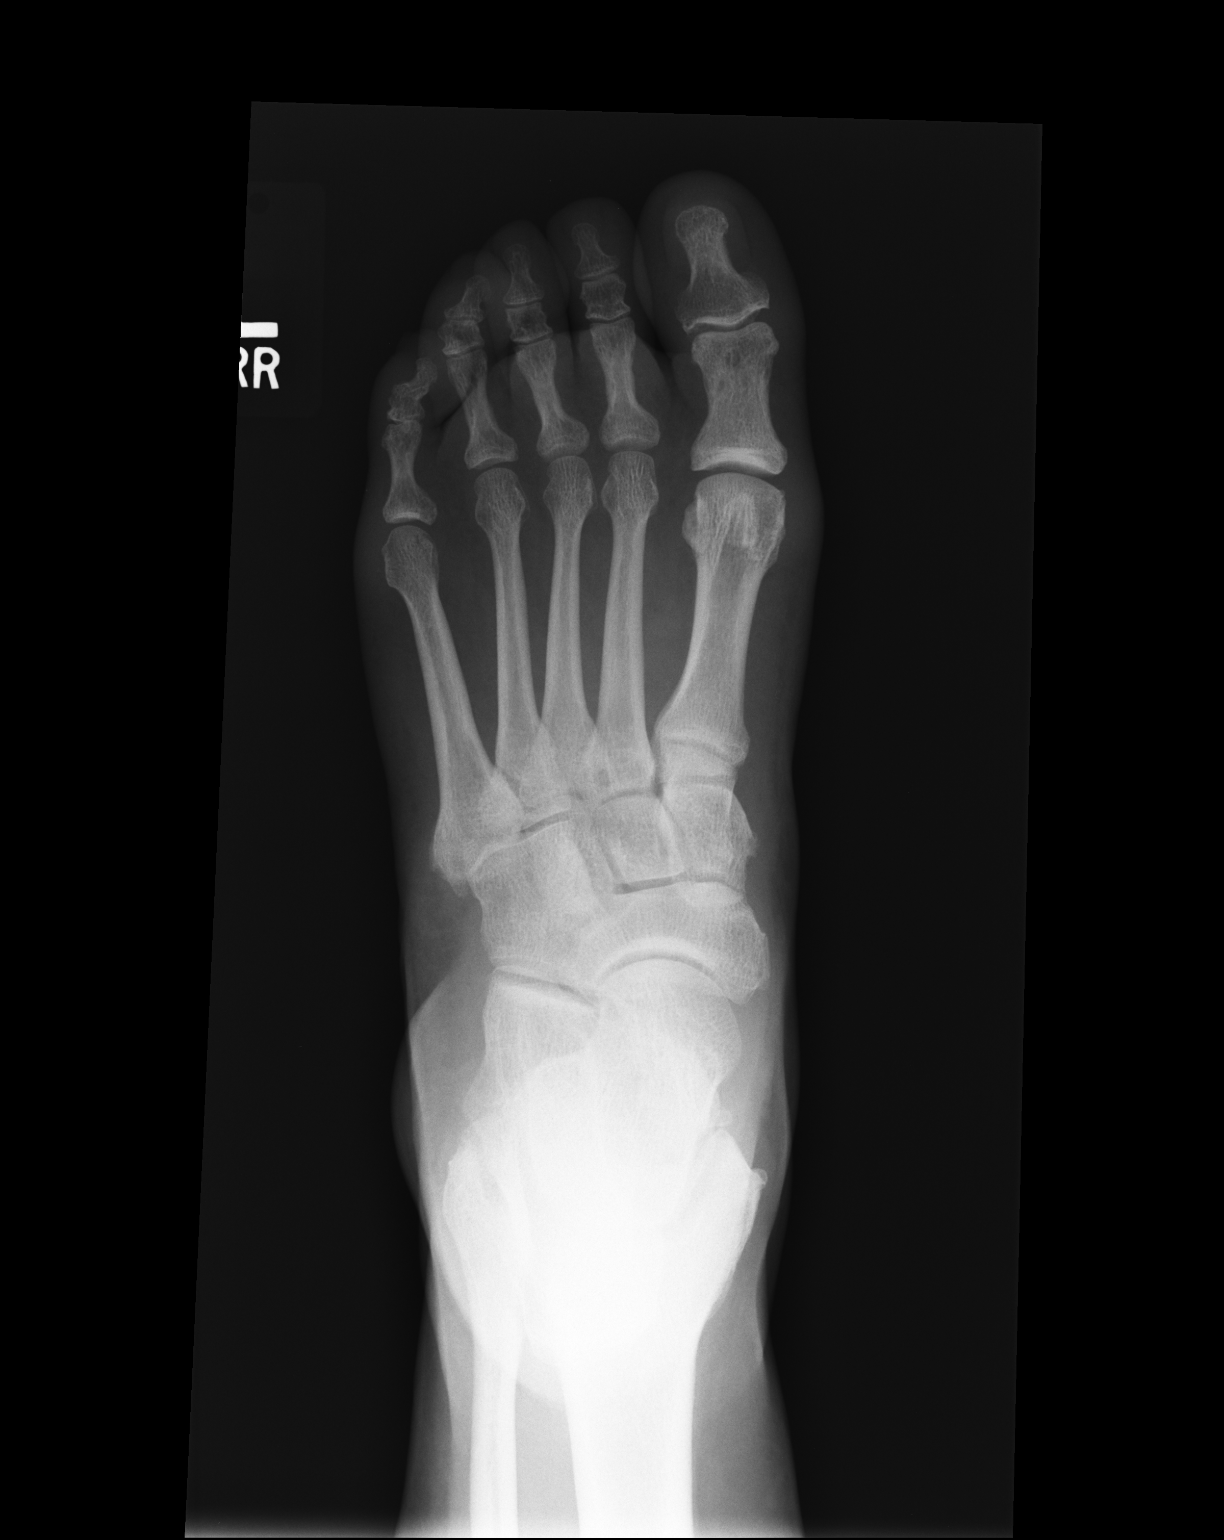

[ap obl int rot]
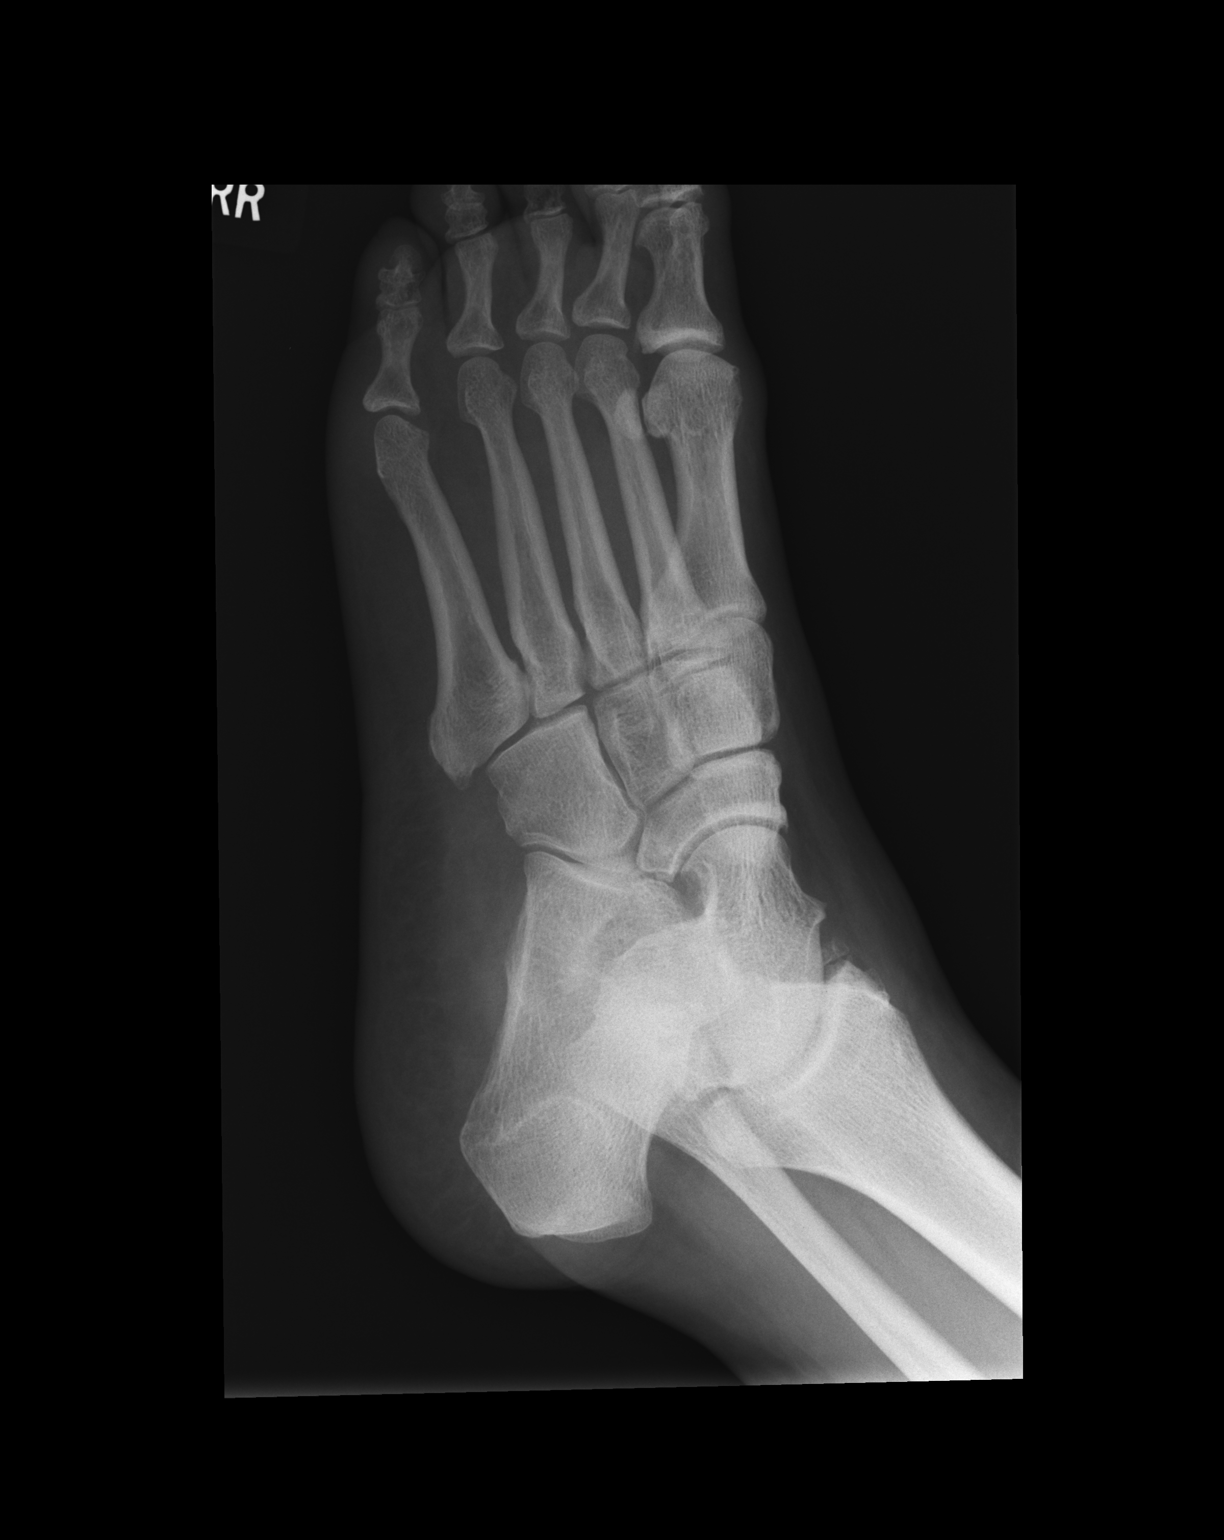

[lateral]
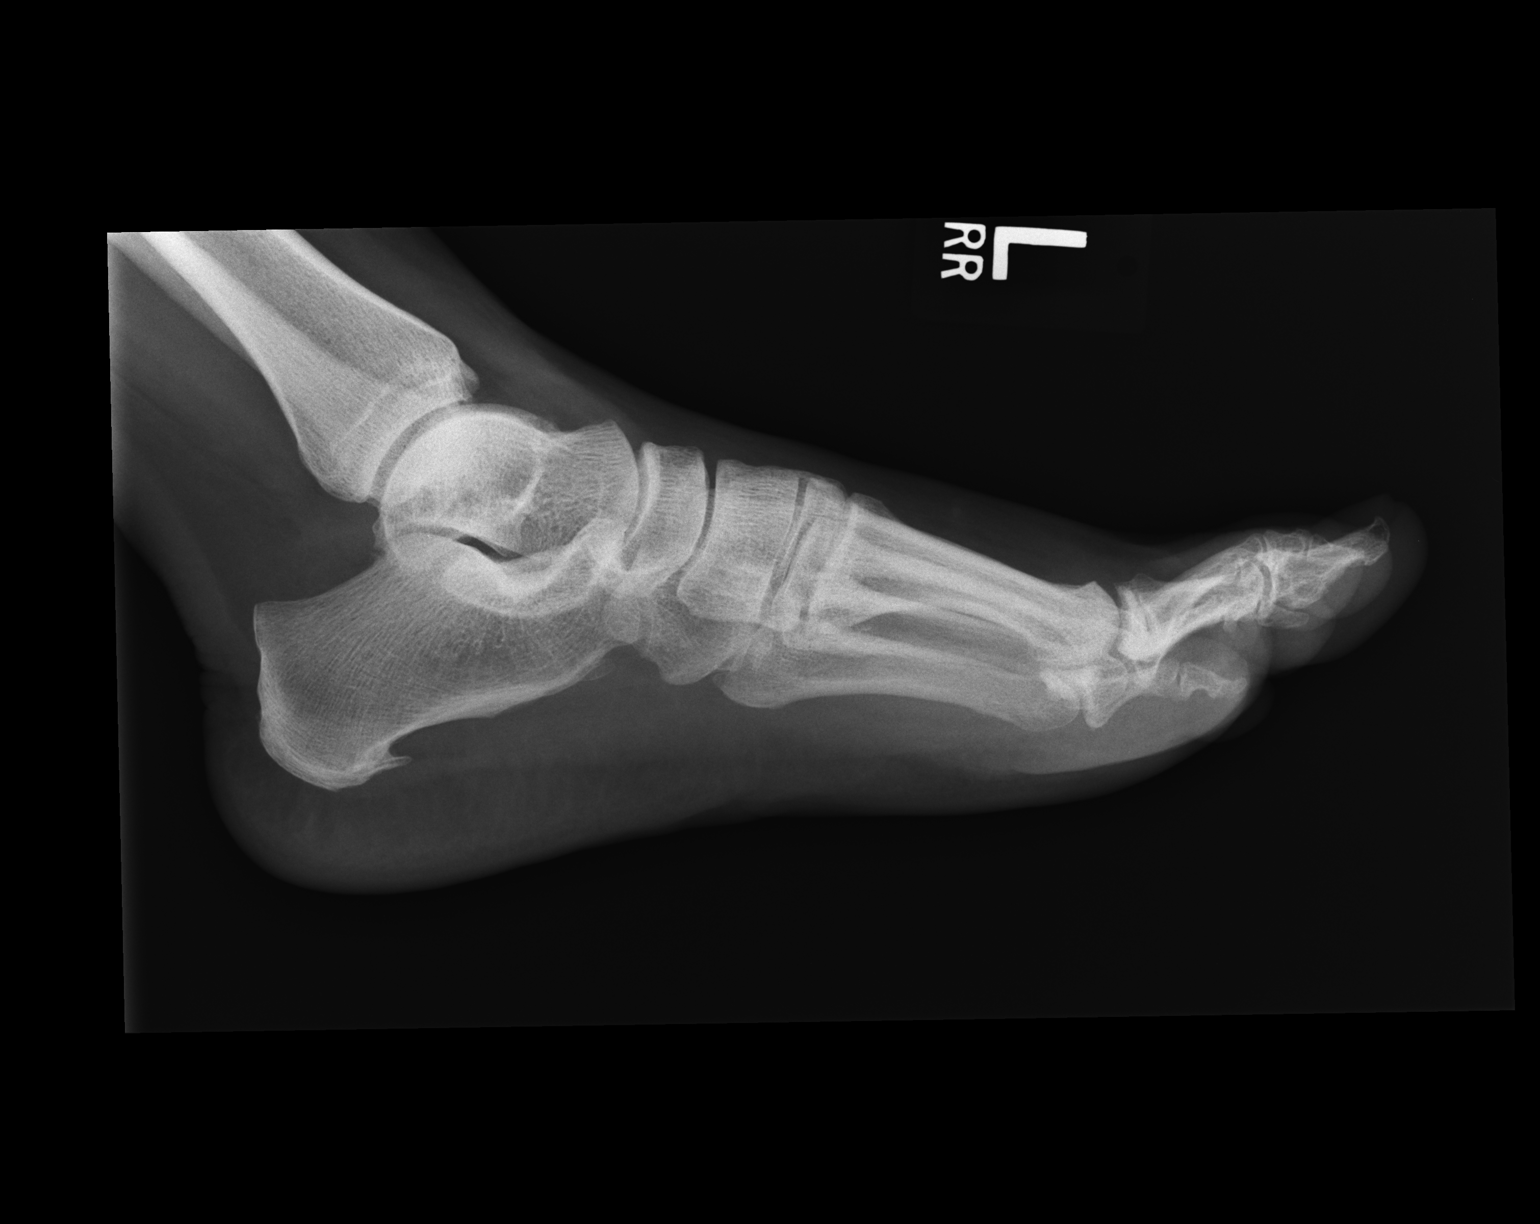

[3 of 3 positions shown; findings below may reference images not displayed]

FINDINGS: The bones of the left foot are adequately mineralized. There is no
definite acute fracture. There is longitudinally oriented lucency
through the base of the distal phalanx of the great toe on the
oblique view only. This likely reflects overlap of soft tissue
interfaces. The interphalangeal, metatarsophalangeal, and
tarsometatarsal joints are normal in appearance. The metatarsal
bases are intact. The tarsal bones are unremarkable. There is a
moderate-sized plantar calcaneal spur. There are mild degenerative
changes associated with the medial aspect of the tibiotalar joint.
No acute ankle fracture is demonstrated, but the ankle is not well
demonstrated on this foot series.
IMPRESSION: 1. There is no definite acute bony abnormality of the left foot.
2. Correlation with any symptoms over the base of the distal phalanx
of the great toe is needed. Here lucency is seen on one view only
that could reflect a nondisplaced fracture.

## 2016-10-28 ENCOUNTER — Encounter: Payer: Self-pay | Admitting: Family Medicine

## 2016-11-25 LAB — HM MAMMOGRAPHY

## 2016-12-24 ENCOUNTER — Encounter: Payer: Self-pay | Admitting: Family Medicine

## 2016-12-24 ENCOUNTER — Ambulatory Visit (INDEPENDENT_AMBULATORY_CARE_PROVIDER_SITE_OTHER): Payer: BC Managed Care – PPO | Admitting: Family Medicine

## 2016-12-24 VITALS — BP 138/76 | HR 77 | Temp 98.4°F | Ht 63.0 in | Wt 187.6 lb

## 2016-12-24 DIAGNOSIS — E7800 Pure hypercholesterolemia, unspecified: Secondary | ICD-10-CM

## 2016-12-24 DIAGNOSIS — E039 Hypothyroidism, unspecified: Secondary | ICD-10-CM

## 2016-12-24 DIAGNOSIS — I10 Essential (primary) hypertension: Secondary | ICD-10-CM

## 2016-12-24 DIAGNOSIS — E669 Obesity, unspecified: Secondary | ICD-10-CM

## 2016-12-24 NOTE — Progress Notes (Signed)
Joan [name] is a 66 y.o. female is here to [facility].   Patient Care Team: Briscoe Deutscher, DO as PCP - General (Family Medicine)   History of Present Illness:   Shaune Pascal CMA acting as scribe for Dr. Juleen China.  HPI Patient comes in today to establish care. She has been going to the [place] for her medical care. She has no concerns today.   1. Essential hypertension Home blood pressure readings at goal.  Avoiding excessive salt intake? [x]   YES  []   NO Trying to exercise on a regular basis? [x]   YES  []   NO Review: taking medications as instructed, no medication side effects noted, no TIAs, no chest pain on exertion, no dyspnea on exertion, no swelling of ankles.   Wt Readings from Last 3 Encounters:  12/24/16 187 lb 9.6 oz (85.1 kg)  05/08/16 189 lb 9.6 oz (86 kg)  03/06/15 192 lb 6.4 oz (87.3 kg)    reports that she has never smoked. She has never used smokeless tobacco. BP Readings from Last 3 Encounters:  12/24/16 138/76  05/08/16 132/84  03/06/15 128/88    2. Acquired hypothyroidism Endocrine ROS: negative for - malaise/lethargy, mood swings, palpitations, polydipsia/polyuria or skin changes.     3. Hyperlipidemia Is the patient taking medications without problems? [x]   YES  []   NO Does the patient complain of muscle aches?   []   YES  [x]    NO Trying to exercise on a regular basis? [x]   YES  []   NO Diet Compliance: compliant most of the time. Concerns: None Cardiovascular ROS: negative for - chest pain or shortness of breath.   Health Maintenance Due  Topic Date Due  . Hepatitis C Screening  11/14/1950  . TETANUS/TDAP  02/01/1968  . MAMMOGRAM  02/01/1999  . COLONOSCOPY  02/01/1999  . DEXA SCAN  01/31/2014  . PNA vac Low Risk Adult (1 of 2 - PCV13) 01/31/2014   PMHx, SurgHx, SocialHx, Medications, and Allergies were reviewed in the Visit Navigator and updated as appropriate.   Past Medical History:  Diagnosis Date  . Allergy   . Hyperlipidemia   .  Hypertension   . Thyroid disease    Past Surgical History:  Procedure Laterality Date  . SPINE SURGERY     Family History  Problem Relation Age of Onset  . Heart disease Mother   . Diabetes Sister    Social History  Substance Use Topics  . Smoking status: Never Smoker  . Smokeless tobacco: Never Used  . Alcohol use No   Current Medications and Allergies:   Current Outpatient Prescriptions:  .  cetirizine (ZYRTEC) 10 MG tablet, Take 10 mg by mouth daily., Disp: , Rfl:  .  hydrochlorothiazide (MICROZIDE) 12.5 MG capsule, Take 12.5 mg by mouth daily., Disp: , Rfl:  .  levothyroxine (SYNTHROID, LEVOTHROID) 137 MCG tablet, Take 137 mcg by mouth daily., Disp: , Rfl:  .  pravastatin (PRAVACHOL) 20 MG tablet, Take 20 mg by mouth daily., Disp: , Rfl:   Allergies  Allergen Reactions  . Penicillins Hives   Review of Systems:   Review of Systems  Constitutional: Negative for chills, fever and malaise/fatigue.  HENT: Negative for ear pain, sinus pain and sore throat.   Eyes: Negative for blurred vision and double vision.  Respiratory: Negative for cough, shortness of breath and wheezing.   Cardiovascular: Negative for chest pain, palpitations and leg swelling.  Gastrointestinal: Negative for abdominal pain, nausea and vomiting.  Musculoskeletal: Negative  for back pain, joint pain and neck pain.  Neurological: Negative for dizziness and headaches.  Psychiatric/Behavioral: Negative for depression, hallucinations and memory loss.   Vitals:   Vitals:   12/24/16 0836  BP: 138/76  Pulse: 77  Temp: 98.4 F (36.9 C)  TempSrc: Oral  SpO2: 97%  Weight: 187 lb 9.6 oz (85.1 kg)  Height: 5\' 3"  (1.6 m)     Body mass index is 33.23 kg/m.  Physical Exam:   Physical Exam  Constitutional: She appears well-developed and well-nourished. No distress.  HENT:  Head: Normocephalic and atraumatic.  Eyes: EOM are normal. Pupils are equal, round, and reactive to light.  Neck: Normal range  of motion. Neck supple.  Cardiovascular: Normal rate, regular rhythm and intact distal pulses.   Pulmonary/Chest: Effort normal.  Abdominal: Soft.  Skin: Skin is warm.  Psychiatric: She has a normal mood and affect. Her behavior is normal.  Nursing note and vitals reviewed.  Assessment and Plan:   Joan was seen today for establish care.  Diagnoses and all orders for this visit:  Essential hypertension Comments: No signs of complications, medication side effects, or red flags.  Continue current regimen.    Acquired hypothyroidism Comments: No signs of complications, medication side effects, or red flags.  Continue current regimen.    Pure hypercholesterolemia Comments: No signs of complications, medication side effects, or red flags.  Continue current regimen.     Obesity (BMI 30-39.9) Comments: The patient is asked to make an attempt to improve diet and exercise patterns to aid in medical management of this problem.     . Reviewed expectations re: course of current medical issues. . Discussed self-management of symptoms. . Outlined signs and symptoms indicating need for more acute intervention. . Patient verbalized understanding and all questions were answered. Marland Kitchen Health Maintenance issues including appropriate healthy diet, exercise, and smoking avoidance were discussed with patient. . See orders for this visit as documented in the electronic medical record. . Patient received an After Visit Summary.  CMA served as Education administrator during this visit. History, Physical, and Plan performed by medical provider. The above documentation has been reviewed and is accurate and complete. Briscoe Deutscher, D.O.  Briscoe Deutscher, DO [place], [place] 12/26/2016  No future appointments.

## 2016-12-26 ENCOUNTER — Encounter: Payer: Self-pay | Admitting: Family Medicine

## 2016-12-29 LAB — BASIC METABOLIC PANEL
BUN: 18 (ref 4–21)
Creatinine: 0.8 (ref <=1.1)
Glucose: 112
POTASSIUM: 4 (ref 3.4–5.3)
SODIUM: 143 (ref 137–147)

## 2016-12-29 LAB — TSH: TSH: 0.22 — AB (ref <=5.90)

## 2016-12-29 LAB — VITAMIN D 25 HYDROXY (VIT D DEFICIENCY, FRACTURES): Vit D, 25-Hydroxy: 35.78

## 2016-12-29 LAB — LIPID PANEL
CHOLESTEROL: 218 — AB (ref 0–200)
HDL: 59 (ref 35–70)
LDL CALC: 135
TRIGLYCERIDES: 122 (ref 40–160)

## 2016-12-29 LAB — HEMOGLOBIN A1C: Hemoglobin A1C: 6.4

## 2017-01-07 ENCOUNTER — Telehealth: Payer: Self-pay | Admitting: Family Medicine

## 2017-01-07 NOTE — Telephone Encounter (Signed)
Left message for patient to return call.

## 2017-01-07 NOTE — Telephone Encounter (Signed)
Patient would like to check the status of the [place] paperwork she mailed in.   Also, patient would like to discuss where the patient should get her vitamin D and vitamin B and glucomin.   Please call patient to advise.

## 2017-02-09 ENCOUNTER — Telehealth: Payer: Self-pay | Admitting: *Deleted

## 2017-02-09 NOTE — Telephone Encounter (Signed)
Left VM requesting a call back to schedule AWV with Cassie.

## 2017-03-27 DIAGNOSIS — Z23 Encounter for immunization: Secondary | ICD-10-CM | POA: Diagnosis not present

## 2017-09-15 NOTE — Telephone Encounter (Signed)
Pt returned your call to set up a awv please call [phone]

## 2017-10-07 ENCOUNTER — Ambulatory Visit (INDEPENDENT_AMBULATORY_CARE_PROVIDER_SITE_OTHER): Payer: Medicare Other | Admitting: *Deleted

## 2017-10-07 ENCOUNTER — Telehealth: Payer: Self-pay

## 2017-10-07 ENCOUNTER — Encounter: Payer: Self-pay | Admitting: *Deleted

## 2017-10-07 VITALS — BP 126/80 | HR 93 | Ht 63.0 in | Wt 191.0 lb

## 2017-10-07 DIAGNOSIS — Z0000 Encounter for general adult medical examination without abnormal findings: Secondary | ICD-10-CM

## 2017-10-07 NOTE — Telephone Encounter (Signed)
Turmeric 1000 mg daily.

## 2017-10-07 NOTE — Telephone Encounter (Signed)
In for AWV 04/04  Stated Dr. Juleen China told her there was something better than glucosamine for her joints.   Please advise and I will call her to let her know. Tks,

## 2017-10-07 NOTE — Patient Instructions (Addendum)
Joan [name] , Thank you for taking time to come for your Medicare Wellness Visit. I appreciate your ongoing commitment to your health goals. Please review the following plan we discussed and let me know if I can assist you in the future.   Next time you see your doctor at the [place] Ask about the shingrix  Check on your hep c Mammogram [facility] with [facility] DEXA or bone density; On Vit D currently  Pneumonia - you need Prevnar 13 and PSV 23   The Centers for Disease Control are now recommending 2 pneumonia vaccinations after 57. The first is the Prevnar 13. This helps to boost your immunity to community acquired pneumonia as well as some protection from bacterial pneumonia  The 2nd is the pneumovax 23, which offers more broad protection!  Please consider taking these as this is your best protection against pneumonia.     These are the goals we discussed: Goals    . Exercise 150 min/wk Moderate Activity     [facility] gym at [place] lots of water More activity Plant based meals   Check out  online nutrition programs as GumSearch.nl and http://vang.com/; fit54me; Weight watchers has an app  Look for foods with "whole" wheat; bran; oatmeal etc Shot at the farmer's markets in season for fresher choices  Watch for "hydrogenated" on the label of oils which are trans-fats.  Watch for "high fructose corn syrup" in snacks, yogurt or ketchup  Meats have less marbling; bright colored fruits and vegetables;  Canned; dump out liquid and wash vegetables. Be mindful of what we are eating  Portion control is essential to a health weight! Sit down; take a break and enjoy your meal; take smaller bites; put the fork down between bites;  It takes 20 minutes to get full; so check in with your fullness cues and stop eating when you start to fill full              This is a list of the screening recommended for you and due  dates:  Health Maintenance  Topic Date Due  .  Hepatitis C: One time screening is recommended by Center for Disease Control  (CDC) for  adults born from [birth year] through 1967.   14-Jan-1951  . Tetanus Vaccine  02/01/1968  . Mammogram  02/01/1999  . Colon Cancer Screening  02/01/1999  . DEXA scan (bone density measurement)  01/31/2014  . Pneumonia vaccines (1 of 2 - PCV13) 01/31/2014  . Flu Shot  02/03/2018   Summary: Preventive Care for Adults  A healthy lifestyle and preventive care can promote health and wellness. Preventive health guidelines for adults include the following key practices.  . A routine yearly physical is a good way to check with your health care provider about your health and preventive screening. It is a chance to share any concerns and updates on your health and to receive a thorough exam.  . Visit your dentist for a routine exam and preventive care every 6 months. Brush your teeth twice a day and floss once a day. Good oral hygiene prevents tooth decay and gum disease.  . The frequency of eye exams is based on your age, health, family medical history, use  of contact lenses, and other factors. Follow your health care provider's ecommendations for frequency of eye exams.  . Eat a healthy diet. Foods like vegetables, fruits, whole grains, low-fat  dairy products, and lean protein foods contain the nutrients you need without too many calories. Decrease your intake of foods high in solid fats, added sugars, and salt. Eat the right amount of calories for you. Get information about a proper diet from your health care provider, if necessary.  . Regular physical exercise is one of the most important things you can do for your health. Most adults should get at least 150 minutes of moderate-intensity exercise (any activity that increases your heart rate and causes you to sweat) each week. In addition, most adults need muscle-strengthening exercises on 2 or more days a  week.  Silver Sneakers may be a benefit available to you. To determine eligibility, you may visit the website: www.silversneakers.com or contact program at 249-246-1325 Mon-Fri between 8AM-8PM.   . Maintain a healthy weight. The body mass index (BMI) is a screening tool to identify possible weight problems. It provides an estimate of body fat based on height and weight. Your health care provider can find your BMI and can help you achieve or maintain a healthy weight.   For adults 20 years and older: ? A BMI below 18.5 is considered underweight. ? A BMI of 18.5 to 24.9 is normal. ? A BMI of 27 to 28 is considered normal by the Institutes of Health  ? A BMI of 30 and above is considered obese.   . Maintain normal blood lipids and cholesterol levels by exercising and minimizing your intake of saturated fat. Eat a balanced diet with plenty of fruit and vegetables. Blood tests for lipids and cholesterol should begin at age 26 and be repeated every 5 years. If your lipid or cholesterol levels are high, you are over 50, or you are at high risk for heart disease, you may need your cholesterol levels checked more frequently. Ongoing high lipid and cholesterol levels should be treated with medicines if diet and exercise are not working.  . If you smoke, find out from your health care provider how to quit. If you do not use tobacco, please do not start.  . If you choose to drink alcohol, please do not consume more than one drink for women and 2 for men.  One drink is considered to be 12 ounces (355 mL) of beer, 5 ounces (148 mL) of wine, or 1.5 ounces (44 mL) of liquor. Moderation of alcohol intake to this level decreases your risk of breast cancer and liver damage.   . If you are 17-65 years old, ask your health care provider if you should take aspirin to prevent strokes.  . Use sunscreen. Apply sunscreen liberally and repeatedly throughout the day. You should seek shade when your shadow is  shorter than you. Protect yourself by wearing long sleeves, pants, a wide-brimmed hat, and sunglasses year round, whenever you are outdoors.  . Once a month, do a whole body skin exam, using a mirror to look at the skin on your back. Tell your health care provider of new moles, moles that have irregular borders, moles that are larger than a pencil eraser, or moles that have changed in shape or color.  Last, if you have completed an Advanced Directive; please bring a copy and review with your physician and then we will scan to the medical record      Fall Prevention in the [place] can cause injuries and can affect people from all age groups. There are many simple things that you can do to make your home safe and to  help prevent falls. What can I do on the outside of my home?  Regularly repair the edges of walkways and driveways and fix any cracks.  Remove high doorway thresholds.  Trim any shrubbery on the main path into your home.  Use bright outdoor lighting.  Clear walkways of debris and clutter, including tools and rocks.  Regularly check that handrails are securely fastened and in good repair. Both sides of any steps should have handrails.  Install guardrails along the edges of any raised decks or porches.  Have leaves, snow, and ice cleared regularly.  Use sand or salt on walkways during winter months.  In the garage, clean up any spills right away, including grease or oil spills. What can I do in the bathroom?  Use night lights.  Install grab bars by the toilet and in the tub and shower. Do not use towel bars as grab bars.  Use non-skid mats or decals on the floor of the tub or shower.  If you need to sit down while you are in the shower, use a plastic, non-slip stool.  Keep the floor dry. Immediately clean up any water that spills on the floor.  Remove soap buildup in the tub or shower on a regular basis.  Attach bath mats securely with double-sided non-slip  rug tape.  Remove throw rugs and other tripping hazards from the floor. What can I do in the bedroom?  Use night lights.  Make sure that a bedside light is easy to reach.  Do not use oversized bedding that drapes onto the floor.  Have a firm chair that has side arms to use for getting dressed.  Remove throw rugs and other tripping hazards from the floor. What can I do in the kitchen?  Clean up any spills right away.  Avoid walking on wet floors.  Place frequently used items in easy-to-reach places.  If you need to reach for something above you, use a sturdy step stool that has a grab bar.  Keep electrical cables out of the way.  Do not use floor polish or wax that makes floors slippery. If you have to use wax, make sure that it is non-skid floor wax.  Remove throw rugs and other tripping hazards from the floor. What can I do in the stairways?  Do not leave any items on the stairs.  Make sure that there are handrails on both sides of the stairs. Fix handrails that are broken or loose. Make sure that handrails are as long as the stairways.  Check any carpeting to make sure that it is firmly attached to the stairs. Fix any carpet that is loose or worn.  Avoid having throw rugs at the top or bottom of stairways, or secure the rugs with carpet tape to prevent them from moving.  Make sure that you have a light switch at the top of the stairs and the bottom of the stairs. If you do not have them, have them installed. What are some other fall prevention tips?  Wear closed-toe shoes that fit well and support your feet. Wear shoes that have rubber soles or low heels.  When you use a stepladder, make sure that it is completely opened and that the sides are firmly locked. Have someone hold the ladder while you are using it. Do not climb a closed stepladder.  Add color or contrast paint or tape to grab bars and handrails in your home. Place contrasting color strips on the first and  last  steps.  Use mobility aids as needed, such as canes, walkers, scooters, and crutches.  Turn on lights if it is dark. Replace any light bulbs that burn out.  Set up furniture so that there are clear paths. Keep the furniture in the same spot.  Fix any uneven floor surfaces.  Choose a carpet design that does not hide the edge of steps of a stairway.  Be aware of any and all pets.  Review your medicines with your healthcare provider. Some medicines can cause dizziness or changes in blood pressure, which increase your risk of falling. Talk with your health care provider about other ways that you can decrease your risk of falls. This may include working with a physical therapist or trainer to improve your strength, balance, and endurance. This information is not intended to replace advice given to you by your health care provider. Make sure you discuss any questions you have with your health care provider. Document Released: 06/12/2002 Document Revised: 11/19/2015 Document Reviewed: 07/27/2014 Elsevier Interactive Patient Education  [employer].

## 2017-10-07 NOTE — Progress Notes (Signed)
Subjective:   Joan [name] is a 67 y.o. female who presents for an Initial Medicare Annual Wellness Visit.  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 Hepatitis C Screening  03-12-1951  . TETANUS/TDAP  02/01/1968  . MAMMOGRAM  02/01/1999  . COLONOSCOPY  02/01/1999  . DEXA SCAN  01/31/2014  . PNA vac Low Risk Adult (1 of 2 - PCV13) 01/31/2014   Has gone to the [place] for all of her health care needs She will inquire if she has had Hep c She will get the dates or her IMM Has had one pneumonia vaccine [facility]  [facility] and [facility]EXA  Hearing Screening Comments: Hearing  No hearing issues Vision Screening Comments: Vision no issues Checked at the [place]    Cardiac Risk Factors include: advanced age (>95men, >55 women);hypertension     Objective:    Today's Vitals   10/07/17 1600  BP: 126/80  Pulse: 93  SpO2: 97%  Weight: 191 lb (86.6 kg)  Height: 5\' 3"  (1.6 m)   Body mass index is 33.83 kg/m.  Advanced Directives 10/07/2017  Does Patient Have a Medical Advance Directive? Yes    Current Medications (verified) Outpatient Encounter Medications as of 10/07/2017  Medication Sig  . cetirizine (ZYRTEC) 10 MG tablet Take 10 mg by mouth daily.  . cholecalciferol (VITAMIN D) 1000 units tablet Take 1,000 Units by mouth daily.  .  hydrochlorothiazide (MICROZIDE) 12.5 MG capsule Take 12.5 mg by mouth daily.  Marland Kitchen levothyroxine (SYNTHROID, LEVOTHROID) 137 MCG tablet Take 137 mcg by mouth daily.  . Omega-3 Fatty Acids (OMEGA 3 500 PO) Take by mouth.  . pravastatin (PRAVACHOL) 20 MG tablet Take 20 mg by mouth daily.   No facility-administered encounter medications on file as of 10/07/2017.     Allergies (verified) Penicillins   History: Past Medical History:  Diagnosis Date  . Allergy   . Hyperlipidemia   . Hypertension   . Thyroid disease    Past Surgical History:  Procedure Laterality Date  . SPINE SURGERY     Family History  Problem Relation Age of Onset  . Heart disease Mother   . Diabetes Sister    Social History   Socioeconomic History  . Marital status: Married    Spouse name: Not on file  . Number of children: Not on file  . Years of education: Not on file  . Highest education level: Not on file  Occupational History  . Not on file  Social Needs  . Financial resource strain: Not on file  . Food insecurity:    Worry: Not on file    Inability: Not on file  . Transportation needs:    Medical: Not on file    Non-medical: Not on  file  Tobacco Use  . Smoking status: Never Smoker  . Smokeless tobacco: Never Used  Substance and Sexual Activity  . Alcohol use: No  . Drug use: No  . Sexual activity: Not on file  Lifestyle  . Physical activity:    Days per week: Not on file    Minutes per session: Not on file  . Stress: Not on file  Relationships  . Social connections:    Talks on phone: Not on file    Gets together: Not on file    Attends religious service: Not on file    Active member of club or organization: Not on file    Attends meetings of clubs or organizations: Not on file    Relationship status: Not on file  Other Topics Concern  . 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 N  Vision? N  Difficulty concentrating or making decisions? N  Walking or climbing stairs? N  Dressing or bathing? N  Doing errands, shopping? N  Preparing Food and eating ? N  Using the Toilet? N  In the past six months, have you accidently leaked urine? N  Do you have problems with loss of bowel control? N  Managing your Medications? N  Managing your Finances? N  Housekeeping or managing your Housekeeping? N  Some recent data might be hidden     Immunizations and Health Maintenance  There is no immunization history on file for this patient. Health Maintenance Due  Topic Date Due  . Hepatitis C Screening  October 22, 1950  . TETANUS/TDAP  02/01/1968  . MAMMOGRAM  02/01/1999  . COLONOSCOPY  02/01/1999  . DEXA SCAN  01/31/2014  . PNA vac Low Risk Adult (1 of 2 - PCV13) 01/31/2014    Patient Care Team: Briscoe Deutscher, DO as PCP - General (Family Medicine)  Indicate any recent Medical Services you may have received from other than [facility] providers in the past year (date may be approximate).     Assessment:   This is a routine wellness examination for [name].  Hearing/Vision screen Hearing Screening Comments: Hearing  No hearing issues Vision Screening Comments: Vision no issues Checked at the [place] issues and exercise activities discussed: Current Exercise Habits: Structured exercise class(exercise at least 5 days)  Goals    . Exercise 150 min/wk Moderate Activity     [facility] and go to the gym at [place] lots of water More activity Plant based meals   Check out  online nutrition programs as  GumSearch.nl and http://vang.com/; fit27me; Weight watchers has an app  Look for foods with "whole" wheat; bran; oatmeal etc Shot at the farmer's markets in season for fresher choices  Watch for "hydrogenated" on the label of oils which are trans-fats.  Watch for "high fructose corn syrup" in snacks, yogurt or ketchup  Meats have less marbling; bright colored fruits  and vegetables;  Canned; dump out liquid and wash vegetables. Be mindful of what we are eating  Portion control is essential to a health weight! Sit down; take a break and enjoy your meal; take smaller bites; put the fork down between bites;  It takes 20 minutes to get full; so check in with your fullness cues and stop eating when you start to fill full             Depression Screen PHQ 2/9 Scores 10/07/2017 05/08/2016 03/06/2015  PHQ - 2 Score 0 0 0    Fall Risk Fall Risk  10/07/2017 05/08/2016 03/05/2016  Falls in the past year? No No No  Comment - - Emmi Telephone Survey: data to providers prior to load     Cognitive Function: MMSE - Mini Mental State Exam 10/07/2017  Not completed: (No Data)     Ad8 score reviewed for issues:  Issues making decisions:  Less interest in hobbies / activities:  Repeats questions, stories (family complaining):  Trouble using ordinary gadgets (microwave, computer, phone):  Forgets the month or year:   Mismanaging finances:   Remembering appts:  Daily problems with thinking and/or memory: Ad8 score is=0 Still teaching         Screening Tests Health Maintenance  Topic Date Due  . Hepatitis C Screening  Nov 10, 1950  . TETANUS/TDAP  02/01/1968  . MAMMOGRAM  02/01/1999  . COLONOSCOPY  02/01/1999  . DEXA SCAN  01/31/2014  . PNA vac Low Risk Adult (1 of 2 - PCV13) 01/31/2014  . INFLUENZA VACCINE  02/03/2018        Plan:      PCP Notes   Health Maintenance Has gone to the [place] for all of her health care needs She will inquire if she has had Hep c She will  get the dates or her IMM Has had one pneumonia vaccine [facility] discussing colon[facility]  [facility] following mammogram and DEXA and is current (was placed on 5000 vit D qd due to [place])  Postponed the above until she returns to Dr. Juleen China In June for her annual and will bring this information with her    Abnormal Screens  BMI but plans to join weight watchers  Will start exercising when school is out  Referrals  none  Patient concerns; Was told by Dr. Juleen China there was a better OTC med to take besides glucosamine. Will fup via basket note to Dr. Juleen China  Added Omega 3's OTC to her med list   Nurse Concerns; As noted  Next PCP apt Will schedule annual in June, but will see the [name] prior to coming in to see Dr. Juleen China and will bring her information on preventive health      I have personally reviewed and noted the following in the patient's chart:   . Medical and social history . Use of alcohol, tobacco or illicit drugs  . Current medications and supplements . Functional ability and status . Nutritional status . Physical activity . Advanced directives . List of other physicians . Hospitalizations, surgeries, and ER visits in previous 12 months . Vitals . Screenings to include cognitive, depression, and falls . Referrals and appointments  In addition, I have reviewed and discussed with patient certain preventive protocols, quality metrics, and best practice recommendations. A written personalized care plan for preventive services as well as general preventive health recommendations were provided to patient.     Wynetta Fines, RN   10/07/2017

## 2017-10-08 NOTE — Telephone Encounter (Signed)
Call to the patient and left a VM Left my direct line at [facility] for questions [phone]

## 2017-10-11 ENCOUNTER — Encounter: Payer: Self-pay | Admitting: *Deleted

## 2017-10-11 NOTE — Progress Notes (Signed)
I have personally reviewed the Medicare Annual Wellness Visit and agree with the assessment and plan.  Algis Greenhouse. Jerline Pain, MD 10/11/2017 8:47 AM

## 2017-11-23 ENCOUNTER — Encounter: Payer: Self-pay | Admitting: Emergency Medicine

## 2017-11-23 ENCOUNTER — Ambulatory Visit (INDEPENDENT_AMBULATORY_CARE_PROVIDER_SITE_OTHER): Payer: Medicare Other | Admitting: Emergency Medicine

## 2017-11-23 VITALS — BP 128/80 | HR 75 | Temp 98.4°F | Resp 17 | Ht 63.5 in | Wt 186.0 lb

## 2017-11-23 DIAGNOSIS — H1031 Unspecified acute conjunctivitis, right eye: Secondary | ICD-10-CM | POA: Diagnosis not present

## 2017-11-23 MED ORDER — TOBRAMYCIN-DEXAMETHASONE 0.3-0.1 % OP SUSP: 2.0000 [drp] | OPHTHALMIC | 1 refills | Status: AC

## 2017-11-23 NOTE — Progress Notes (Signed)
Joan [name] 67 y.o.   Chief Complaint  Patient presents with  . Conjunctivitis    HISTORY OF PRESENT ILLNESS: This is a 67 y.o. 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  No other significant symptoms.  Denies nausea, vomiting, headache, or severe eye pain.  HPI   Prior to Admission medications   Medication Sig Start Date End Date Taking? Authorizing Provider  cholecalciferol (VITAMIN D) 1000 units tablet Take 1,000 Units by mouth daily.   Yes [provider]  hydrochlorothiazide (MICROZIDE) 12.5 MG capsule Take 12.5 mg by mouth daily.   Yes [provider]  levothyroxine (SYNTHROID, LEVOTHROID) 137 MCG tablet Take 137 mcg by mouth daily.   Yes [provider]  Omega-3 Fatty Acids (OMEGA 3 500 PO) Take by mouth.   Yes [provider]    Allergies  Allergen Reactions  . Penicillins Hives    Patient Active Problem List   Diagnosis Date Noted  . HTN (hypertension) 12/30/2012  . Pure hypercholesterolemia 12/30/2012  . Hypothyroidism 12/30/2012    Past Medical History:  Diagnosis Date  . Allergy   . Hyperlipidemia   . Hypertension   . Thyroid disease     Past Surgical History:  Procedure Laterality Date  . SPINE SURGERY      Social History   Socioeconomic History  . Marital status: Married    Spouse name: Not on file  . Number of children: Not on file  . Years of education: Not on file  . Highest education level: Not on file  Occupational History  . Not on file  Social Needs  . Financial resource strain: Not on file  . Food insecurity:    Worry: Not on file    Inability: Not on file  . Transportation needs:    Medical: Not on file    Non-medical: Not on file  Tobacco Use  . Smoking status: Never Smoker  . Smokeless tobacco: Never Used  Substance and Sexual Activity  . Alcohol use: No  .  Drug use: No  . Sexual activity: Not on file  Lifestyle  . Physical activity:    Days per week: Not on file    Minutes per session: Not on file  . Stress: Not on file  Relationships  . Social connections:    Talks on phone: Not on file    Gets together: Not on file    Attends religious service: Not on file    Active member of club or organization: Not on file    Attends meetings of clubs or organizations: Not on file    Relationship status: Not on file  . Intimate partner violence:    Fear of current or ex partner: Not on file    Emotionally abused: Not on file    Physically abused: Not on file    Forced sexual activity: Not on file  Other Topics Concern  . 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  Heart disease Mother   . Diabetes Sister      ROS   Physical Exam  Constitutional: She is oriented to person, place, and time. She appears well-developed and well-nourished.  HENT:  [name]: Normocephalic and atraumatic.  Eyes: Pupils are equal, round, and reactive to light. EOM are normal. Right eye exhibits discharge.  Right eye: Positive eyelid swelling with injected conjunctiva and purulent discharge.  No signs of iritis.  Neck: Normal range of motion. Neck supple.  Cardiovascular: Normal rate and regular rhythm.  Pulmonary/Chest: Effort normal.  Musculoskeletal: Normal range of motion.  Neurological: She is alert and oriented to person, place, and time. No cranial nerve deficit.  Skin: Skin is warm and dry. Capillary refill takes less than 2 seconds.  Psychiatric: She has a normal mood and affect. Her behavior is normal.  Vitals reviewed.    ASSESSMENT & PLAN: [name] was seen today for  conjunctivitis.  Diagnoses and all orders for this visit:  Acute bacterial conjunctivitis of right eye -     tobramycin-dexamethasone (TOBRADEX) ophthalmic solution; Place 2 drops into the right eye every 4 (four) hours while awake for 5 days.    Patient Instructions       IF you received an x-ray today, you will receive an invoice from [facility]. Please contact [facility] at [phone] with questions or concerns regarding your invoice.   IF you received labwork today, you will receive an invoice from [place]. Please contact [facility] at [phone] with questions or concerns regarding your invoice.   Our billing staff will not be able to assist you with questions regarding bills from these companies.  You will be contacted with the lab results as soon as they are available. The fastest way to get your results is to activate your My Chart account. Instructions are located on the last page of this paperwork. If you have not heard from [place] regarding the results in 2 weeks, please contact this office.      Bacterial Conjunctivitis Bacterial conjunctivitis is an infection of your conjunctiva. This is the clear membrane that covers the white part of your eye and the inner surface of your eyelid. This condition can make your eye:  Red or pink.  Itchy.  This condition is caused by bacteria. This condition spreads very easily from person to person (is contagious) and from one eye to the other eye. Follow these instructions at home: Medicines  Take or apply your antibiotic medicine as told by your doctor. Do not stop taking or applying the antibiotic even if you start to feel better.  Take or apply over-the-counter and prescription medicines only as told by your doctor.  Do not touch your eyelid with the eye drop bottle or the ointment tube. Managing discomfort  Wipe any fluid from your eye with a warm, wet washcloth or a cotton ball.  Place a cool, clean  washcloth on your eye. Do this for 10-20 minutes, 3-4 times per day. General instructions  Do not wear contact lenses until the irritation is gone. Wear glasses until your doctor says it is okay to wear contacts.  Do not wear eye makeup until your symptoms are gone. Throw away any old makeup.  Change or wash your pillowcase every day.  Do not share towels or washcloths with anyone.  Wash your hands often with soap and water. Use paper towels to dry your hands.  Do not touch or rub your eyes.  Do not drive or use heavy machinery if  your vision is blurry. Contact a doctor if:  You have a fever.  Your symptoms do not get better after 10 days. Get help right away if:  You have a fever and your symptoms suddenly get worse.  You have very bad pain when you move your eye.  Your face: ? Hurts. ? Is red. ? Is swollen.  You have sudden loss of vision. This information is not intended to replace advice given to you by your health care provider. Make sure you discuss any questions you have with your health care provider. Document Released: 03/31/2008 Document Revised: 11/28/2015 Document Reviewed: 04/04/2015 Elsevier Interactive Patient Education  2018 Elsevier Inc.      Agustina Caroli, MD [facility]

## 2017-11-23 NOTE — Patient Instructions (Addendum)
     IF you received an x-ray today, you will receive an invoice from [facility]. Please contact [facility] at [phone] with questions or concerns regarding your invoice.   IF you received labwork today, you will receive an invoice from [place]. Please contact [facility] at [phone] with questions or concerns regarding your invoice.   Our billing staff will not be able to assist you with questions regarding bills from these companies.  You will be contacted with the lab results as soon as they are available. The fastest way to get your results is to activate your My Chart account. Instructions are located on the last page of this paperwork. If you have not heard from [place] regarding the results in 2 weeks, please contact this office.      Bacterial Conjunctivitis Bacterial conjunctivitis is an infection of your conjunctiva. This is the clear membrane that covers the white part of your eye and the inner surface of your eyelid. This condition can make your eye:  Red or pink.  Itchy.  This condition is caused by bacteria. This condition spreads very easily from person to person (is contagious) and from one eye to the other eye. Follow these instructions at home: Medicines  Take or apply your antibiotic medicine as told by your doctor. Do not stop taking or applying the antibiotic even if you start to feel better.  Take or apply over-the-counter and prescription medicines only as told by your doctor.  Do not touch your eyelid with the eye drop bottle or the ointment tube. Managing discomfort  Wipe any fluid from your eye with a warm, wet washcloth or a cotton ball.  Place a cool, clean washcloth on your eye. Do this for 10-20 minutes, 3-4 times per day. General instructions  Do not wear contact lenses until the irritation is gone. Wear glasses until your doctor says it is okay to wear contacts.  Do not wear eye makeup until your symptoms are gone. Throw away  any old makeup.  Change or wash your pillowcase every day.  Do not share towels or washcloths with anyone.  Wash your hands often with soap and water. Use paper towels to dry your hands.  Do not touch or rub your eyes.  Do not drive or use heavy machinery if your vision is blurry. Contact a doctor if:  You have a fever.  Your symptoms do not get better after 10 days. Get help right away if:  You have a fever and your symptoms suddenly get worse.  You have very bad pain when you move your eye.  Your face: ? Hurts. ? Is red. ? Is swollen.  You have sudden loss of vision. This information is not intended to replace advice given to you by your health care provider. Make sure you discuss any questions you have with your health care provider. Document Released: 03/31/2008 Document Revised: 11/28/2015 Document Reviewed: 04/04/2015 Elsevier Interactive Patient Education  [employer].

## 2017-11-26 ENCOUNTER — Encounter: Payer: Self-pay | Admitting: Emergency Medicine

## 2017-11-26 ENCOUNTER — Ambulatory Visit (INDEPENDENT_AMBULATORY_CARE_PROVIDER_SITE_OTHER): Payer: Medicare Other | Admitting: Emergency Medicine

## 2017-11-26 ENCOUNTER — Other Ambulatory Visit: Payer: Self-pay

## 2017-11-26 VITALS — BP 120/80 | HR 83 | Temp 98.6°F | Resp 18 | Ht 63.07 in | Wt 188.8 lb

## 2017-11-26 DIAGNOSIS — H1033 Unspecified acute conjunctivitis, bilateral: Secondary | ICD-10-CM | POA: Diagnosis not present

## 2017-11-26 DIAGNOSIS — H1031 Unspecified acute conjunctivitis, right eye: Secondary | ICD-10-CM

## 2017-11-26 NOTE — Progress Notes (Signed)
Joan Joan [name] 67 y.o.   Chief Complaint  Patient presents with  . Conjunctivitis    f/u- pt states in both eyes    HISTORY OF PRESENT ILLNESS: This is a 67 y.o. female here for follow-up of conjunctivitis to the right eye.  Seen by me 11/23/2017 and started on TobraDex.  States conjunctivitis moved to the left eye so she he started using the eyedrops in left eye it as well.  Doing a lot better.  At least 50% better.  HPI   Prior to Admission medications   Medication Sig Start Date End Date Taking? Authorizing Provider  cholecalciferol (VITAMIN D) 1000 units tablet Take 1,000 Units by mouth daily.   Yes [provider]  hydrochlorothiazide (MICROZIDE) 12.5 MG capsule Take 12.5 mg by mouth daily.   Yes [provider]  levothyroxine (SYNTHROID, LEVOTHROID) 137 MCG tablet Take 137 mcg by mouth daily.   Yes [provider]  Omega-3 Fatty Acids (OMEGA 3 500 PO) Take by mouth.   Yes [provider]  tobramycin-dexamethasone [facility]) ophthalmic solution Place 2 drops into the right eye every 4 (four) hours while awake for 5 days. 11/23/17 11/28/17 Yes [place], [place]    Allergies  Allergen Reactions  . Penicillins Hives    Patient Active Problem List   Diagnosis Date Noted  . Acute bacterial conjunctivitis of right eye 11/23/2017  . HTN (hypertension) 12/30/2012  . Pure hypercholesterolemia 12/30/2012  . Hypothyroidism 12/30/2012    Past Medical History:  Diagnosis Date  . Allergy   . Hyperlipidemia   . Hypertension   . Thyroid disease     Past Surgical History:  Procedure Laterality Date  . SPINE SURGERY      Social History   Socioeconomic History  . Marital status: Married    Spouse name: Not on file  . Number of children: 0  . Years of education: Not on file  . Highest education level: Not on file  Occupational History  . Not on file  Social Needs  . Financial resource strain: Not on file  . Food insecurity:      Worry: Not on file    Inability: Not on file  . Transportation needs:    Medical: Not on file    Non-medical: Not on file  Tobacco Use  . Smoking status: Never Smoker  . Smokeless tobacco: Never Used  Substance and Sexual Activity  . Alcohol use: No  . Drug use: No  . Sexual activity: Not on file  Lifestyle  . Physical activity:    Days per week: Not on file    Minutes per session: Not on file  . Stress: Not on file  Relationships  . Social connections:    Talks on phone: Not on file    Gets together: Not on file    Attends religious service: Not on file    Active member of club or organization: Not on file    Attends meetings of clubs or organizations: Not on file    Relationship status: Not on file  . Intimate partner violence:    Fear of current or ex partner: Not on file    Emotionally abused: Not on file    Physically abused: Not on file    Forced sexual activity: Not on file  Other Topics Concern  . 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 Heart disease Mother   . Diabetes Sister      Review of Systems  Constitutional: Negative for chills and fever.  Eyes: Negative for blurred vision, double vision, photophobia, pain, discharge and redness.  Respiratory: Negative for shortness of breath.   Cardiovascular: Negative for chest pain.  Gastrointestinal: Negative for nausea and vomiting.  Skin: Negative for rash.  Neurological: Negative for dizziness and headaches.  Endo/Heme/Allergies: Negative.   All other systems reviewed and are negative.   Vitals:   11/26/17 1020  BP: 120/80  Pulse: 83  Resp: 18  Temp: 98.6 F (37 C)  SpO2: 97%    Physical Exam  Constitutional:  She is oriented to person, place, and time. She appears well-developed and well-nourished.  HENT:  Joan [name]: Normocephalic and atraumatic.  Eyes: Pupils are equal, round, and reactive to light. Conjunctivae and EOM are normal.  Neck: Normal range of motion. Neck supple.  Cardiovascular: Normal rate and regular rhythm.  Pulmonary/Chest: Effort normal.  Musculoskeletal: Normal range of motion.  Neurological: She is alert and oriented to person, place, and time. No sensory deficit. She exhibits normal muscle tone.  Skin: Skin is warm and dry. Capillary refill takes less than 2 seconds.  Psychiatric: She has a normal mood and affect. Her behavior is normal.  Vitals reviewed.    ASSESSMENT & PLAN: Joan [name] was seen today for conjunctivitis.  Diagnoses and all orders for this visit:  Acute bacterial conjunctivitis of right eye Comments: Improved  Acute bacterial conjunctivitis of both eyes Comments: improved    Patient Instructions    Continue eyedrops to both eyes 3 times a day for 3 more days. Bacterial Conjunctivitis Bacterial conjunctivitis is an infection of your conjunctiva. This is the clear membrane that covers the white part of your eye and the inner surface of your eyelid. This condition can make your eye:  Red or pink.  Itchy.  This condition is caused by bacteria. This condition spreads very easily from person to person (is contagious) and from one eye to the other eye. Follow these instructions at home: Medicines  Take or apply your antibiotic medicine as told by your doctor. Do not stop taking or applying the antibiotic even if you start to feel better.  Take or apply over-the-counter and prescription medicines only as told by your doctor.  Do not touch your eyelid with the eye drop bottle or the ointment tube. Managing discomfort  Wipe any fluid from your eye with a warm, wet washcloth or a cotton ball.  Place a cool, clean washcloth on your eye. Do this for  10-20 minutes, 3-4 times per day. General instructions  Do not wear contact lenses until the irritation is gone. Wear glasses until your doctor says it is okay to wear contacts.  Do not wear eye makeup until your symptoms are gone. Throw away any old makeup.  Change or wash your pillowcase every day.  Do not share towels or washcloths with anyone.  Wash your hands often with soap and water. Use paper towels to dry your hands.  Do not touch or rub your eyes.  Do not drive or use heavy machinery if your vision  is blurry. Contact a doctor if:  You have a fever.  Your symptoms do not get better after 10 days. Get help right away if:  You have a fever and your symptoms suddenly get worse.  You have very bad pain when you move your eye.  Your face: ? Hurts. ? Is red. ? Is swollen.  You have sudden loss of vision. This information is not intended to replace advice given to you by your health care provider. Make sure you discuss any questions you have with your health care provider. Document Released: 03/31/2008 Document Revised: 11/28/2015 Document Reviewed: 04/04/2015 Elsevier Interactive Patient Education  2018 [employer].    IF you received an x-ray today, you will receive an invoice from [facility]. Please contact [facility] at [phone] with questions or concerns regarding your invoice.   IF you received labwork today, you will receive an invoice from [place]. Please contact [facility] at [phone] with questions or concerns regarding your invoice.   Our billing staff will not be able to assist you with questions regarding bills from these companies.  You will be contacted with the lab results as soon as they are available. The fastest way to get your results is to activate your My Chart account. Instructions are located on the last page of this paperwork. If you have not heard from [place] regarding the results in 2 weeks, please contact this  office.         Agustina Caroli, MD [facility]

## 2017-11-26 NOTE — Patient Instructions (Addendum)
  Continue eyedrops to both eyes 3 times a day for 3 more days. Bacterial Conjunctivitis Bacterial conjunctivitis is an infection of your conjunctiva. This is the clear membrane that covers the white part of your eye and the inner surface of your eyelid. This condition can make your eye:  Red or pink.  Itchy.  This condition is caused by bacteria. This condition spreads very easily from person to person (is contagious) and from one eye to the other eye. Follow these instructions at home: Medicines  Take or apply your antibiotic medicine as told by your doctor. Do not stop taking or applying the antibiotic even if you start to feel better.  Take or apply over-the-counter and prescription medicines only as told by your doctor.  Do not touch your eyelid with the eye drop bottle or the ointment tube. Managing discomfort  Wipe any fluid from your eye with a warm, wet washcloth or a cotton ball.  Place a cool, clean washcloth on your eye. Do this for 10-20 minutes, 3-4 times per day. General instructions  Do not wear contact lenses until the irritation is gone. Wear glasses until your doctor says it is okay to wear contacts.  Do not wear eye makeup until your symptoms are gone. Throw away any old makeup.  Change or wash your pillowcase every day.  Do not share towels or washcloths with anyone.  Wash your hands often with soap and water. Use paper towels to dry your hands.  Do not touch or rub your eyes.  Do not drive or use heavy machinery if your vision is blurry. Contact a doctor if:  You have a fever.  Your symptoms do not get better after 10 days. Get help right away if:  You have a fever and your symptoms suddenly get worse.  You have very bad pain when you move your eye.  Your face: ? Hurts. ? Is red. ? Is swollen.  You have sudden loss of vision. This information is not intended to replace advice given to you by your health care provider. Make sure you discuss  any questions you have with your health care provider. Document Released: 03/31/2008 Document Revised: 11/28/2015 Document Reviewed: 04/04/2015 Elsevier Interactive Patient Education  2018 [employer].    IF you received an x-ray today, you will receive an invoice from [facility]. Please contact [facility] at [phone] with questions or concerns regarding your invoice.   IF you received labwork today, you will receive an invoice from [place]. Please contact [facility] at [phone] with questions or concerns regarding your invoice.   Our billing staff will not be able to assist you with questions regarding bills from these companies.  You will be contacted with the lab results as soon as they are available. The fastest way to get your results is to activate your My Chart account. Instructions are located on the last page of this paperwork. If you have not heard from [place] regarding the results in 2 weeks, please contact this office.

## 2017-12-24 ENCOUNTER — Telehealth: Payer: Self-pay | Admitting: Family Medicine

## 2017-12-24 NOTE — Telephone Encounter (Signed)
Please call patient to schedule yearly visit. She has not seen Dr. Juleen China since 12/24/16

## 2018-01-06 NOTE — Progress Notes (Signed)
Subjective:    Joan [name] is a 67 y.o. female and is here for a comprehensive physical exam.  Health Maintenance Due  Topic Date Due  . COLONOSCOPY  02/01/1999     Current Outpatient Medications:  .  Black Elderberry (SAMBUCUS ELDERBERRY PO), Take by mouth., Disp: , Rfl:  .  cetirizine (ZYRTEC) 10 MG tablet, Take 10 mg by mouth daily., Disp: , Rfl:  .  cholecalciferol (VITAMIN D) 1000 units tablet, Take 1,000 Units by mouth daily., Disp: , Rfl:  .  cromolyn (OPTICROM) 4 % ophthalmic solution, Place 2 drops into both eyes daily., Disp: , Rfl:  .  hydrochlorothiazide (MICROZIDE) 12.5 MG capsule, Take 12.5 mg by mouth daily., Disp: , Rfl:  .  levothyroxine (SYNTHROID, LEVOTHROID) 137 MCG tablet, Take 137 mcg by mouth daily., Disp: , Rfl:  .  Omega-3 Fatty Acids (OMEGA 3 500 PO), Take by mouth., Disp: , Rfl:  .  Turmeric 500 MG TABS, Take by mouth., Disp: , Rfl:  .  vitamin B-12 (CYANOCOBALAMIN) 500 MCG tablet, Take 500 mcg by mouth daily., Disp: , Rfl:   PMHx, SurgHx, SocialHx, Medications, and Allergies were reviewed in the Visit Navigator and updated as appropriate.   Past Medical History:  Diagnosis Date  . Allergy   . Hyperlipidemia   . Hypertension   . Thyroid disease     Past Surgical History:  Procedure Laterality Date  . SPINE SURGERY      Family History  Problem Relation Age of Onset  . Heart disease Mother   . Diabetes Sister    Social History   Tobacco Use  . Smoking status: Never Smoker  . Smokeless tobacco: Never Used  Substance Use Topics  . Alcohol use: No  . Drug use: No    Review of Systems:   Pertinent items are noted in the HPI. Otherwise, ROS is negative.  Objective:   BP 124/78 (BP Location: Left Arm, Patient Position: Sitting, Cuff Size: Normal)   Pulse 81   Temp 98.1 F (36.7 C) (Oral)   Ht 5\' 3"  (1.6 m)   Wt 190 lb 3.2 oz (86.3 kg)   SpO2 95%   BMI 33.69 kg/m   General appearance: alert, cooperative and appears stated  age. Head: normocephalic, without obvious abnormality, atraumatic. Neck: no adenopathy, supple, symmetrical, trachea midline; thyroid not enlarged, symmetric, no tenderness/mass/nodules. Lungs: clear to auscultation bilaterally. Heart: regular rate and rhythm Abdomen: soft, non-tender; no masses,  no organomegaly. Extremities: extremities normal, atraumatic, no cyanosis or edema. Skin: skin color, texture, turgor normal, no rashes or lesions. Lymph: cervical, supraclavicular, and axillary nodes normal; no abnormal inguinal nodes palpated. Neurologic: grossly normal.                                      Assessment/Plan:   Joan [name] was seen today for annual exam.  Diagnoses and all orders for this visit:  Routine physical examination  Essential hypertension  Acquired hypothyroidism  Pure hypercholesterolemia    Patient Counseling: [x]    Nutrition: Stressed importance of moderation in sodium/caffeine intake, saturated fat and cholesterol, caloric balance, sufficient intake of fresh fruits, vegetables, fiber, calcium, iron, and 1 mg of folate supplement per day (for females capable of pregnancy).  [x]    Stressed the importance of regular exercise.   [x]    Substance Abuse: Discussed cessation/primary prevention of tobacco, alcohol, or other drug use; driving or other  dangerous activities under the influence; availability of treatment for abuse.   [x]    Injury prevention: Discussed safety belts, safety helmets, smoke detector, smoking near bedding or upholstery.   [x]    Sexuality: Discussed sexually transmitted diseases, partner selection, use of condoms, avoidance of unintended pregnancy  and contraceptive alternatives.  [x]    Dental health: Discussed importance of regular tooth brushing, flossing, and dental visits.  [x]    Health maintenance and immunizations reviewed. Please refer to Health maintenance section.   Briscoe Deutscher, DO [place]

## 2018-01-07 ENCOUNTER — Ambulatory Visit (INDEPENDENT_AMBULATORY_CARE_PROVIDER_SITE_OTHER): Payer: Medicare Other | Admitting: Family Medicine

## 2018-01-07 ENCOUNTER — Encounter: Payer: Self-pay | Admitting: Family Medicine

## 2018-01-07 VITALS — BP 124/78 | HR 81 | Temp 98.1°F | Ht 63.0 in | Wt 190.2 lb

## 2018-01-07 DIAGNOSIS — I10 Essential (primary) hypertension: Secondary | ICD-10-CM

## 2018-01-07 DIAGNOSIS — E039 Hypothyroidism, unspecified: Secondary | ICD-10-CM | POA: Diagnosis not present

## 2018-01-07 DIAGNOSIS — E7800 Pure hypercholesterolemia, unspecified: Secondary | ICD-10-CM

## 2018-01-07 DIAGNOSIS — Z0000 Encounter for general adult medical examination without abnormal findings: Secondary | ICD-10-CM

## 2018-01-20 NOTE — Telephone Encounter (Signed)
Copied from [place] [id number]. Topic: General - Other >> Jan 20, 2018  2:53 PM Joan [name] wrote: Reason for CRM: Patient is calling in her A1C which is 6.6. Please advise

## 2018-01-21 NOTE — Telephone Encounter (Signed)
A1c 6.6 is diabetes. Call patient to make sure that she understands this. We CAN start Metformin OR recheck in 3 months after lifestyle changes. Would recommend dietician referral. Goes to [place], though.

## 2018-01-21 NOTE — Telephone Encounter (Signed)
FYI

## 2018-01-21 NOTE — Telephone Encounter (Signed)
That would be great! 40 minute visit.

## 2018-01-21 NOTE — Telephone Encounter (Signed)
Spoke with the patient and she would like to talk to the dietician first. Do you want [place] to schedule her with [facility]?

## 2018-01-24 NOTE — Telephone Encounter (Signed)
Can you please call and schedule patient with [facility].

## 2018-01-31 ENCOUNTER — Ambulatory Visit (INDEPENDENT_AMBULATORY_CARE_PROVIDER_SITE_OTHER): Payer: Medicare Other | Admitting: Physician Assistant

## 2018-01-31 ENCOUNTER — Encounter: Payer: Self-pay | Admitting: Physician Assistant

## 2018-01-31 VITALS — BP 120/78 | HR 84 | Temp 98.1°F | Ht 63.0 in | Wt 187.5 lb

## 2018-01-31 DIAGNOSIS — E119 Type 2 diabetes mellitus without complications: Secondary | ICD-10-CM

## 2018-01-31 DIAGNOSIS — E669 Obesity, unspecified: Secondary | ICD-10-CM

## 2018-01-31 DIAGNOSIS — Z713 Dietary counseling and surveillance: Secondary | ICD-10-CM

## 2018-01-31 NOTE — Patient Instructions (Addendum)
HAPPY BIRTHDAY!!!!!!  1. When walking at the gym, pick up your pace! Consider using light arm weights for some additional resistance training.  2. Consider changing up your cheese -- I like sliced swiss.  3. Use smart balance as your spread.  4. Balance out meals and snacks as we discussed.  See you in 1 month!

## 2018-01-31 NOTE — Progress Notes (Signed)
Joan [name] is a 67 y.o. female here for a Nutrition Consult.  I acted as a Education administrator for [employer], PA-C Anselmo Pickler, LPN  History of Present Illness:   Chief Complaint  Patient presents with  . Nutrition Counseling    HPI  Patient is here to discuss diet and weight loss for diabetes.Pt would like to lose 30 pounds.  Last month was HgbA1c was 6.6.  Dietary recall: Wakes up at 4-5 am Breakfast -- 2 boiled eggs, slice of cheese Snack -- pistachios Lunch -- salad (lettuce, tomato, cucumber, peppers, chicken w/ peppercorn) Dinner -- protein, veggies Beverages  -- flavored no-calorie water  Weight: Wt Readings from Last 3 Encounters:  01/31/18 187 lb 8 oz (85 kg)  01/07/18 190 lb 3.2 oz (86.3 kg)  11/26/17 188 lb 12.8 oz (85.6 kg)    Exercise: 3 days a week, walks x 1 hour  Support system: Husband  Estimated daily energy needs: Calories: 1400-1600 kcal Protein: 60-85 g Fluid: 2000 ml  Past Medical History:  Diagnosis Date  . Allergy   . Hyperlipidemia   . Hypertension   . Thyroid disease      Social History   Socioeconomic History  . Marital status: Married    Spouse name: Not on file  . Number of children: 0  . Years of education: Not on file  . Highest education level: Not on file  Occupational History  . Not on file  Social Needs  . Financial resource strain: Not on file  . Food insecurity:    Worry: Not on file    Inability: Not on file  . Transportation needs:    Medical: Not on file    Non-medical: Not on file  Tobacco Use  . Smoking status: Never Smoker  . Smokeless tobacco: Never Used  Substance and Sexual Activity  . Alcohol use: No  . Drug use: No  . Sexual activity: Not on file  Lifestyle  . Physical activity:    Days per week: Not on file    Minutes per session: Not on file  . Stress: Not on file  Relationships  . Social connections:    Talks on phone: Not on file    Gets together: Not on file    Attends  religious service: Not on file    Active member of club or organization: Not on file    Attends meetings of clubs or organizations: Not on file    Relationship status: Not on file  . Intimate partner violence:    Fear of current or ex partner: Not on file    Emotionally abused: Not on file    Physically abused: Not on file    Forced sexual activity: Not on file  Other Topics Concern  . 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 SPINE SURGERY      Family History  Problem Relation Age of Onset  . Heart disease Mother   . Diabetes Sister     Allergies  Allergen Reactions  . Penicillins Hives    Current Medications:  Current Outpatient Medications:  .  Black Elderberry (SAMBUCUS ELDERBERRY PO), Take by mouth., Disp: , Rfl:  .  cetirizine (ZYRTEC) 10 MG tablet, Take 10 mg by mouth daily., Disp: , Rfl:  .  cholecalciferol (VITAMIN D) 1000 units tablet, Take 1,000 Units by mouth daily., Disp: , Rfl:  .  cromolyn (OPTICROM) 4 % ophthalmic solution, Place 2 drops into both eyes daily., Disp: , Rfl:  .  hydrochlorothiazide (MICROZIDE) 12.5 MG capsule, Take 12.5 mg by mouth daily., Disp: , Rfl:  .  levothyroxine (SYNTHROID, LEVOTHROID) 137 MCG tablet, Take 137 mcg by mouth daily., Disp: , Rfl:  .  Omega-3 Fatty Acids (OMEGA 3 500 PO), Take by mouth., Disp: , Rfl:  .  Turmeric 500 MG TABS, Take by mouth., Disp: , Rfl:  .  vitamin B-12 (CYANOCOBALAMIN) 500 MCG tablet, Take 500 mcg by mouth daily., Disp: , Rfl:    Review of Systems:   ROS  Negative unless otherwise specified per HPI.  Vitals:   Vitals:   01/31/18 1012  BP: 120/78  Pulse: 84  Temp: 98.1 F (36.7 C)  TempSrc: Oral  SpO2: 95%   Weight: 187 lb 8 oz (85 kg)  Height: 5\' 3"  (1.6 m)     Body mass index is 33.21 kg/m.  Physical Exam:   Physical Exam  Constitutional: She is oriented to person, place, and time. She appears well-developed and well-nourished.  HENT:  Head: Normocephalic and atraumatic.  Eyes: Conjunctivae and EOM are normal.  Neck: Normal range of motion. Neck supple.  Pulmonary/Chest: Effort normal.  Musculoskeletal: Normal range of motion.  Neurological: She is alert and oriented to person, place, and time.  Skin: Skin is warm and dry.  Psychiatric: She has a normal mood and affect. Her behavior is normal. Judgment and thought content normal.    Assessment and Plan:    Joan was seen today for nutrition counseling.  Diagnoses and all orders for this visit:  Obesity (BMI 30-39.9)  Encounter for nutritional counseling  Diabetes mellitus without complication ([place])   Today we reviewed how to balance her breakfast, lunch and dinner.  I provided her handouts on how to do this as well as how to provide more balanced snacks.  We also discussed ways to help make her exercise more efficient.  She wants to follow-up with me in 4 weeks.  Encourage patient provided emotional support.  . Reviewed expectations re: course of current medical issues. . Discussed self-management of symptoms. . Outlined signs and symptoms indicating need for more acute intervention. . Patient verbalized understanding and all questions were answered. . See orders for this visit as documented in the electronic medical record. . Patient received an After-Visit Summary.  CMA or LPN served as scribe during this visit. History, Physical, and Plan performed by medical provider. Documentation and orders reviewed and attested to.  I spent 25 minutes with this patient, greater than 50% was face-to-face time counseling regarding the above diagnoses.   Inda Coke, PA-C

## 2018-02-28 LAB — LIPID PANEL
CHOLESTEROL: 213 — AB (ref 0–200)
HDL: 58 (ref 35–70)
LDL CALC: 135
Triglycerides: 99 (ref 40–160)

## 2018-02-28 LAB — CBC AND DIFFERENTIAL
HCT: 37 (ref 36–46)
Hemoglobin: 11.7 — AB (ref 12.0–16.0)
NEUTROS ABS: 2
Platelets: 330 (ref 150–399)
WBC: 5.6

## 2018-02-28 LAB — HEMOGLOBIN A1C: Hemoglobin A1C: 6.5

## 2018-02-28 LAB — BASIC METABOLIC PANEL
BUN: 21 (ref 4–21)
Creatinine: 0.8 (ref 0.5–1.1)
Glucose: 86
Potassium: 3.4 (ref 3.4–5.3)
Sodium: 138 (ref 137–147)

## 2018-02-28 LAB — HEPATIC FUNCTION PANEL
ALT: 22 (ref 7–35)
AST: 24 (ref 13–35)
Alkaline Phosphatase: 92 (ref 25–125)
BILIRUBIN, TOTAL: 0.4

## 2018-02-28 LAB — TSH: TSH: 0.04 — AB (ref 0.41–5.90)

## 2018-02-28 LAB — VITAMIN D 25 HYDROXY (VIT D DEFICIENCY, FRACTURES): Vit D, 25-Hydroxy: 38.76

## 2018-03-04 ENCOUNTER — Encounter: Payer: Self-pay | Admitting: Physician Assistant

## 2018-03-04 ENCOUNTER — Ambulatory Visit: Payer: Medicare Other | Admitting: Physician Assistant

## 2018-03-04 VITALS — Wt 185.0 lb

## 2018-03-04 VITALS — BP 110/80 | HR 76 | Temp 98.6°F | Ht 63.0 in | Wt 185.0 lb

## 2018-03-04 DIAGNOSIS — E669 Obesity, unspecified: Secondary | ICD-10-CM

## 2018-03-04 NOTE — Progress Notes (Signed)
Patient here for her weight check. Patient weighed in at 185.o lbs today by Merry Lofty, CMA. No questions or concerns from patient.

## 2018-03-04 NOTE — Progress Notes (Signed)
Agree with plan. Reviewed weight. Will continue to support patient as able.

## 2018-03-08 NOTE — Progress Notes (Signed)
Patient was here for weight check only. I did not see patient.

## 2018-04-01 ENCOUNTER — Ambulatory Visit: Payer: Medicare Other

## 2018-04-03 IMAGING — DX DG HIP (WITH OR WITHOUT PELVIS) 2-3V*L*
3 series · 3 of 3 positions shown · non-contrast
Comparison: None in PACs

CLINICAL DATA: Left hip injury yesterday

EXAM:
DG HIP (WITH OR WITHOUT PELVIS) 2-3V LEFT

[pelvis ap]
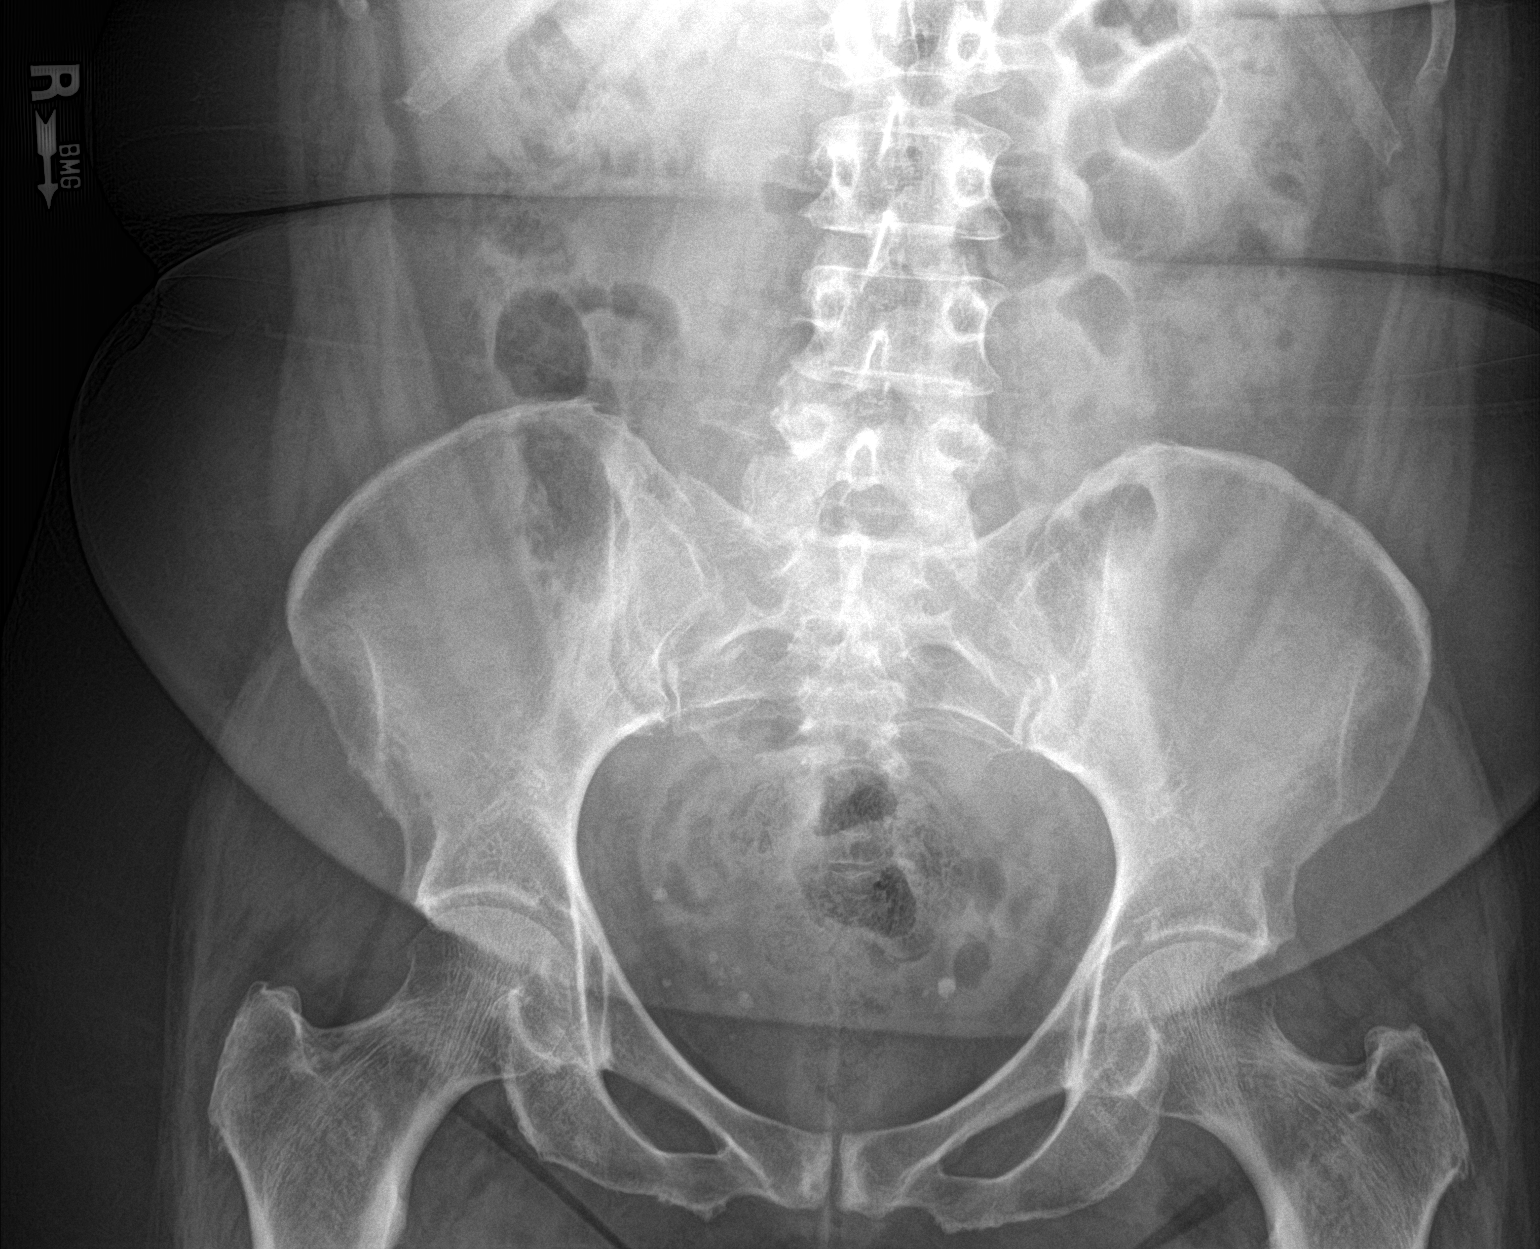

[hip ap]
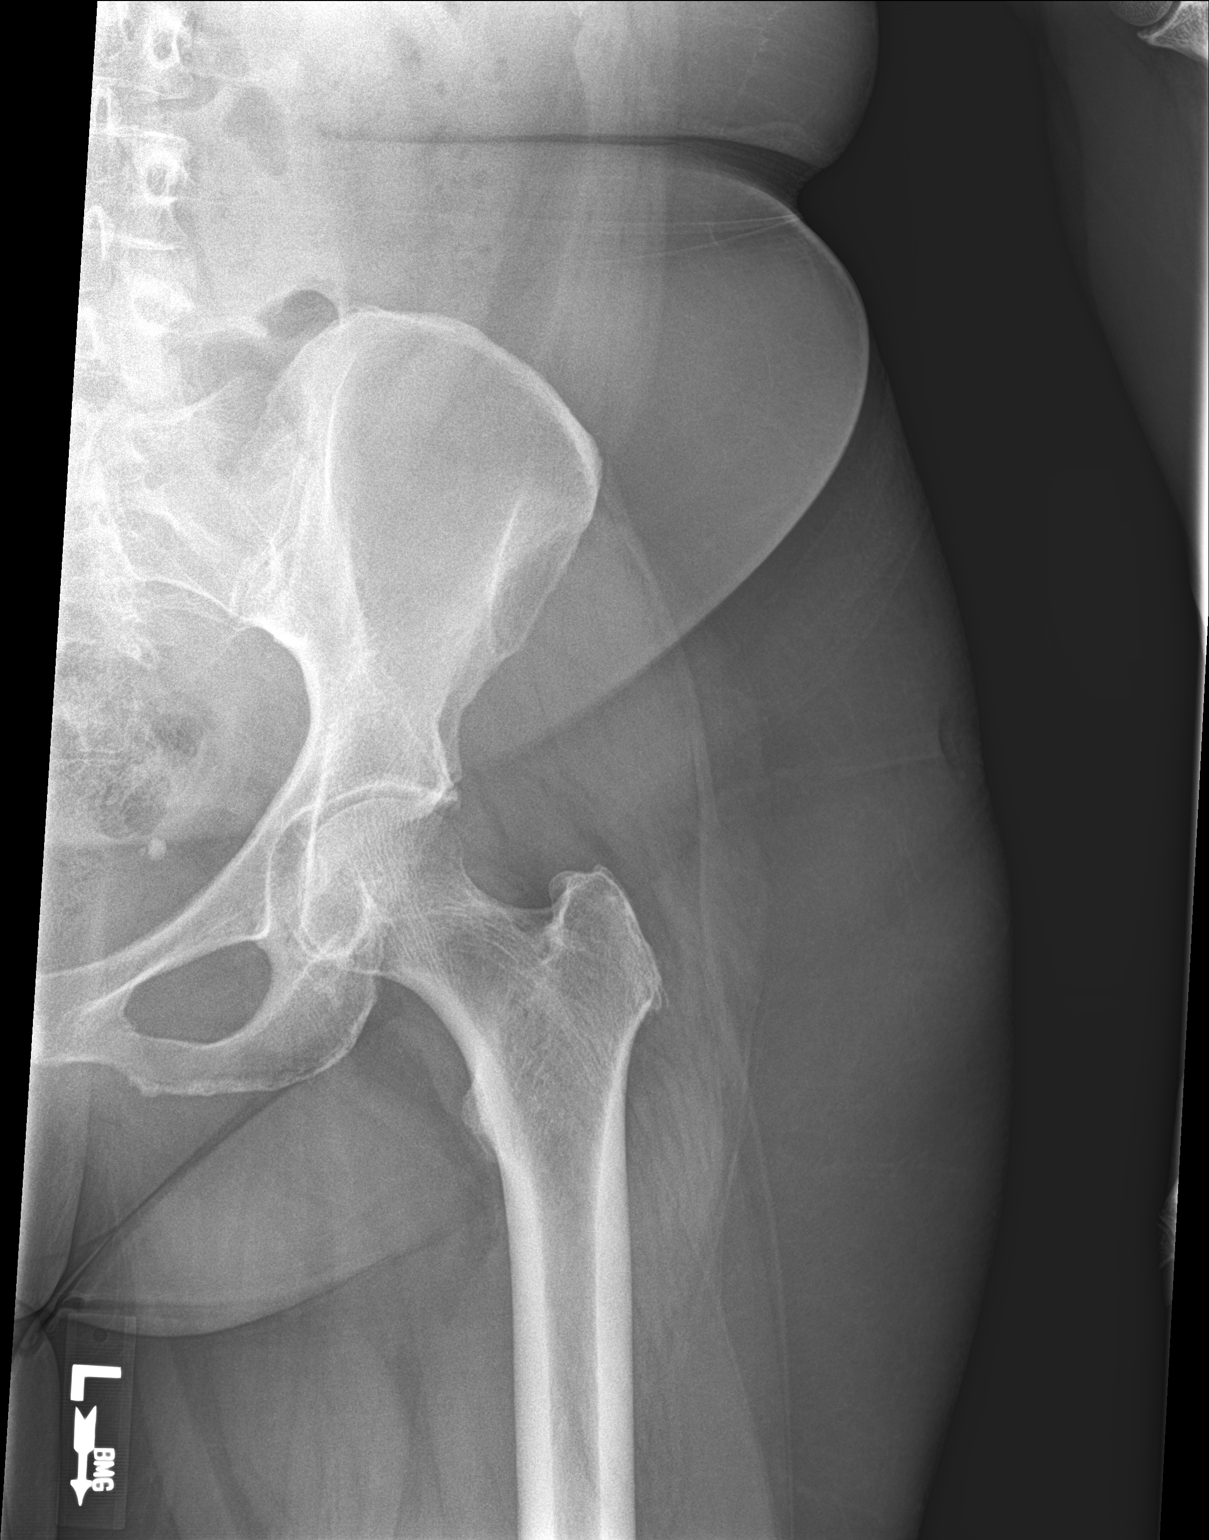

[hip lat]
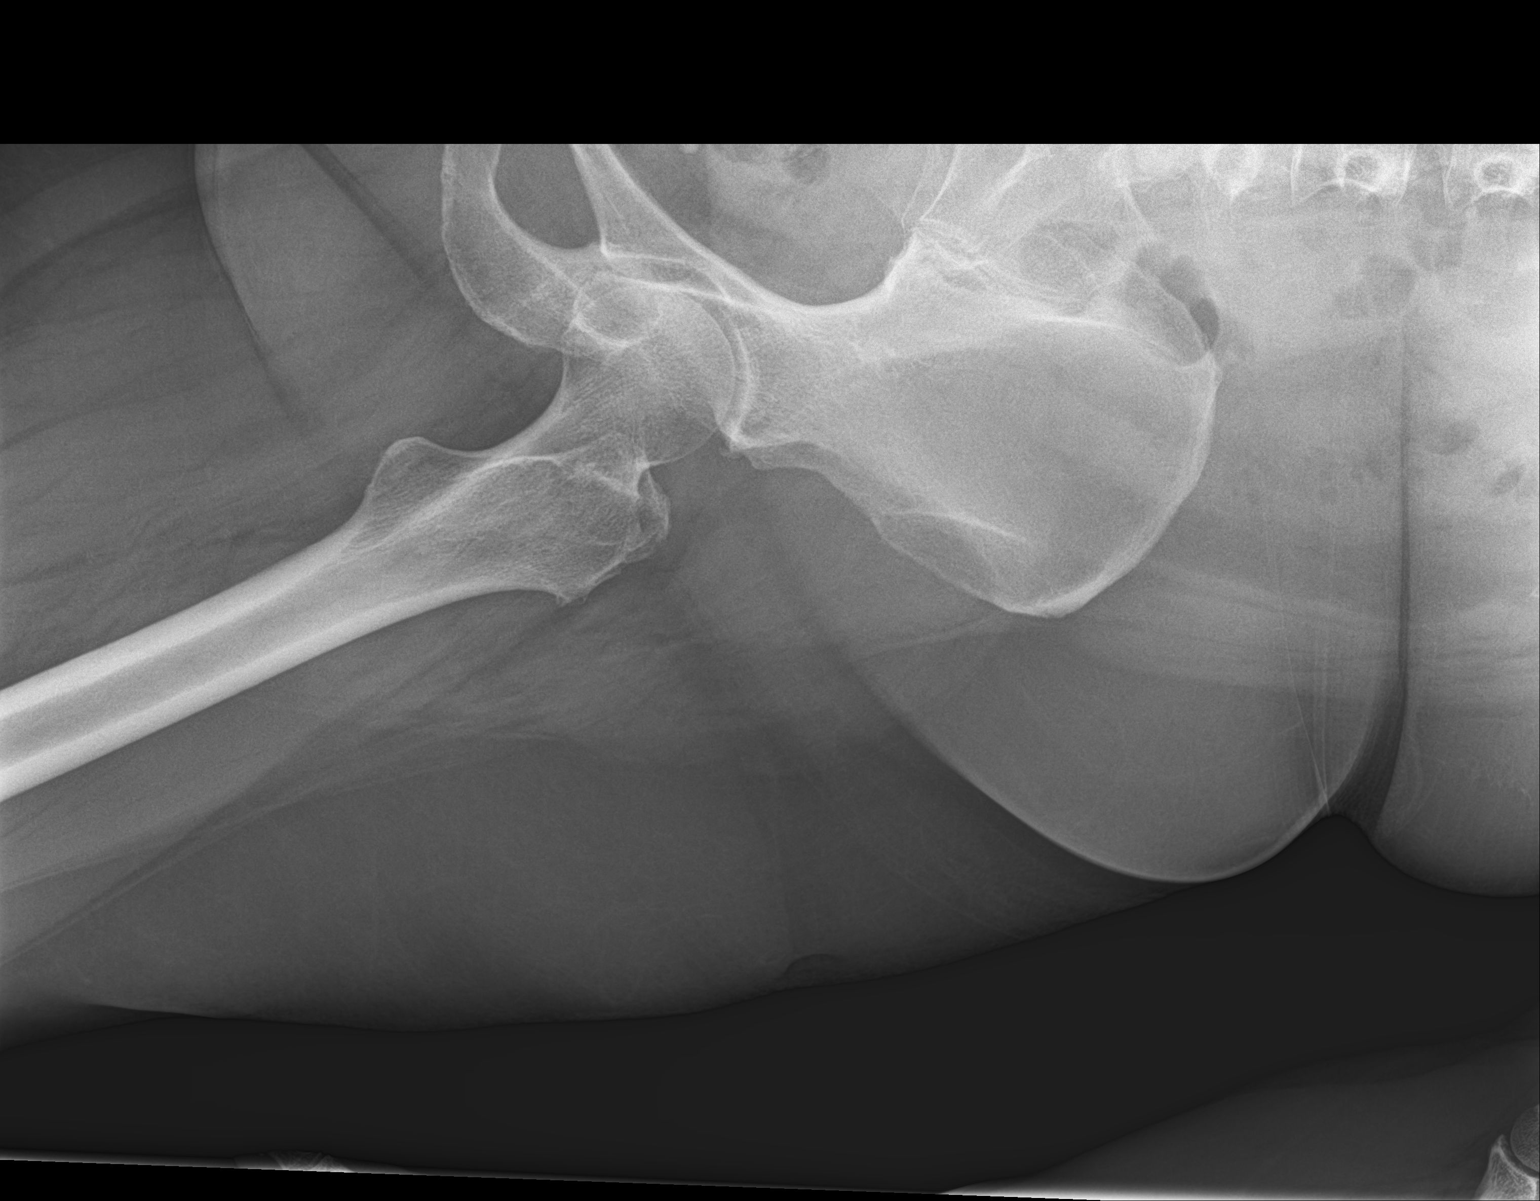

[3 of 3 positions shown; findings below may reference images not displayed]

FINDINGS: The bones are subjectively adequately mineralized. No lytic or
blastic pelvic lesion is observed. No acute pelvic fracture is
demonstrated.

AP and lateral views of the left hip reveal mild symmetric narrowing
of the joint space as compared to the right hip. The articular
surfaces of the left femoral head and acetabulum remain smoothly
rounded. The left femoral neck, intertrochanteric, and
subtrochanteric regions are normal.

There are pelvic phleboliths. The bowel gas pattern is unremarkable.
IMPRESSION: No acute fracture or dislocation of the left hip. Mild symmetric
narrowing of the joint consistent with osteoarthritis. No
significant bony changes.

## 2018-04-06 ENCOUNTER — Ambulatory Visit: Payer: Medicare Other

## 2018-04-15 ENCOUNTER — Ambulatory Visit: Payer: Medicare Other

## 2018-04-15 VITALS — Ht 63.0 in | Wt 182.0 lb

## 2018-04-15 DIAGNOSIS — E669 Obesity, unspecified: Secondary | ICD-10-CM

## 2018-04-15 NOTE — Patient Instructions (Signed)
Health Maintenance Due  Topic Date Due  . URINE MICROALBUMIN  02/01/1961  . COLONOSCOPY  02/01/1999  . INFLUENZA VACCINE  02/03/2018    Depression screen [facility] 2/9 11/26/2017 11/23/2017 10/07/2017  Decreased Interest 0 0 0  Down, Depressed, Hopeless 0 0 0  PHQ - 2 Score 0 0 0

## 2018-04-15 NOTE — Progress Notes (Signed)
Patient here toady for a weight check. Patient down 3lbs since last visit. Patient thrilled with progress.

## 2018-04-19 ENCOUNTER — Encounter: Payer: Self-pay | Admitting: Surgical

## 2018-06-27 LAB — HM MAMMOGRAPHY

## 2018-09-27 ENCOUNTER — Telehealth: Payer: Self-pay | Admitting: Family Medicine

## 2018-09-27 NOTE — Telephone Encounter (Signed)
Left message to return call to our office.  °Need more information  °

## 2018-09-27 NOTE — Telephone Encounter (Signed)
Please call patient to advise on coming in for a lab appointment only. Her insurance wants her to have a second opinion on her lab work. Patient advised she is not sick, no fever cough or chills and wants to see if she can only come in for labs instead. Lab orders would need to be placed. Patient requests a call from the nurse.

## 2018-09-28 ENCOUNTER — Ambulatory Visit: Payer: Medicare Other | Admitting: Family Medicine

## 2018-09-28 NOTE — Telephone Encounter (Signed)
Please call pt and schedule a Webex visit per Dr. Juleen China to discuss what labs she needs.

## 2018-09-28 NOTE — Telephone Encounter (Signed)
Called pt to schedule webex. Pt was not home and is going to call back around 4pm.

## 2018-09-28 NOTE — Telephone Encounter (Signed)
Pt returned Joellen's vm. Unavailable. Please return pt's call.

## 2018-10-02 NOTE — Telephone Encounter (Signed)
Called patient - no answer no vm  ?

## 2018-10-03 NOTE — Telephone Encounter (Signed)
Can you call for app

## 2018-10-03 NOTE — Telephone Encounter (Signed)
Pt stated she needed her paperwork for extended care insurance from [employer] signed and returned to them. Pt stated she did not need an appt.

## 2018-10-04 NOTE — Telephone Encounter (Signed)
I have sent the document to the medical records department via interoffice folder to assist.

## 2018-10-04 NOTE — Telephone Encounter (Signed)
I have reviewed ppw it is just ROI I am going to bring it to you not sure what I need to do with it.

## 2018-10-10 LAB — FECAL OCCULT BLOOD, GUAIAC: Fecal Occult Blood: NEGATIVE

## 2018-12-21 ENCOUNTER — Ambulatory Visit: Payer: Medicare Other | Admitting: Family Medicine

## 2019-01-24 NOTE — Progress Notes (Signed)
Subjective:   The patient is here for annual Medicare wellness examination and management of other chronic and acute problems.  . The risk factors are reflected in the social history. . The roster of all physicians providing medical care to patient - is listed in the Snapshot section of the chart. . Activities of daily living:  The patient is 100% independent in all ADLs: dressing, toileting, feeding as well as independent mobility. . Home safety  The patient has smoke detectors in the home. They wear seatbelts.  There are no firearms at home. There is no violence in the home.  . There is no risks for hepatitis, STDs, or HIV. There is no history of blood transfusion. They have no travel history to infectious disease endemic areas of the world. . The patient has seen a dentist in the last six months. They have seen their eye doctor in the last year. They admit to slight hearing difficulty with regard to whispered voices and some television programs.  They have deferred audiologic testing in the last year.  They do not  have excessive sun exposure. Discussed the need for sun protection: hats, long sleeves and use of sunscreen if there is significant sun exposure.  . The importance of a healthy diet is discussed. They do have a healthy diet. . The benefits of regular aerobic exercise were discussed.  . There are no signs or vegative symptoms of depression, including irritability, change in appetite, anhedonia, sadness/tearfullness. . Cognitive assessment completed and without concerns. . The following portions of the patient's history were reviewed and updated as appropriate: allergies, current medications, past family history, past medical history, past surgical history, past social history  and problem list.  Patient Care Team: Briscoe Deutscher, DO as PCP - General (Family Medicine)  Past medical, surgical, social and family history reviewed and updated:  Patient Active Problem List    Diagnosis Date Noted  . Obesity (BMI 30-39.9) 01/26/2019  . Diabetes mellitus ([place]) 01/25/2019  . HTN (hypertension) 12/30/2012  . Pure hypercholesterolemia 12/30/2012  . Hypothyroidism 12/30/2012   Past Surgical History:  Procedure Laterality Date  . SPINE SURGERY     Social History   Tobacco Use  . Smoking status: Never Smoker  . Smokeless tobacco: Never Used  Substance Use Topics  . Alcohol use: No   Family History  Problem Relation Age of Onset  . Heart disease Mother   . Diabetes Sister    Social History   Socioeconomic History  . Marital status: Married    Spouse name: Not on file  . Number of children: 0  . Years of education: Not on file  . Highest education level: Master's degree (e.g., MA, MS, MEng, MEd, MSW, MBA)  Occupational History    Employer: [employer]  Social Needs  . Financial resource strain: Not very hard  . Food insecurity    Worry: Never true    Inability: Never true  . Transportation needs    Medical: No    Non-medical: No  Tobacco Use  . Smoking status: Never Smoker  . Smokeless tobacco: Never Used  Substance and Sexual Activity  . Alcohol use: No  . Drug use: No  . Sexual activity: Yes    Partners: Male    Birth control/protection: None  Lifestyle  . Physical activity    Days per week: 2 days    Minutes per session: 30 min  . Stress: Not at all  Relationships  . Social connections  Talks on phone: More than three times a week    Gets together: Twice a week    Attends religious service: More than 4 times per year    Active member of club or organization: Yes    Attends meetings of clubs or organizations: More than 4 times per year    Relationship status: Married  . Intimate partner violence    Fear of current or ex partner: No    Emotionally abused: No    Physically abused: No    Forced sexual activity: No  Other Topics Concern  . Not on file  Social History Narrative   Goes to [place]. 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 Married, no children. Adopted at 31 y/o - difficult years.    Current medication list and allergy/intolerance information reviewed and updated:   .  cetirizine (ZYRTEC) 10 MG tablet, Take 10 mg by mouth daily., Disp: , Rfl:  .  hydrochlorothiazide (MICROZIDE) 12.5 MG capsule, Take 12.5 mg by mouth daily., Disp: , Rfl:  .  levothyroxine (SYNTHROID, LEVOTHROID) 137 MCG tablet, Take 137 mcg by mouth daily., Disp: , Rfl:  .  cholecalciferol (VITAMIN D) 1000 units tablet, Take 1,000 Units by mouth daily., Disp: , Rfl:  .  Omega-3 Fatty Acids (OMEGA 3 500 PO), Take by mouth., Disp: , Rfl:  .  Turmeric 500 MG TABS, Take by mouth., Disp: , Rfl:  .  vitamin B-12 (CYANOCOBALAMIN) 500 MCG tablet, Take 500 mcg by mouth daily., Disp: , Rfl:   Allergies  Allergen Reactions  . Penicillins Hives   Review of Systems: No headache, visual changes, nausea, vomiting, diarrhea, constipation, dizziness, abdominal pain, skin rash, fevers, chills, night sweats, weight loss, swollen lymph nodes, body aches, joint swelling, muscle aches, chest pain, shortness of breath, mood changes, visual or auditory hallucinations.   Health Risk Assessment:   CLINICAL INTAKE, INCLUDING ADLS, SENSORY DIFFICULTY Pre-visit preparation completed: Yes  Pain : No/denies pain  Nutritional Status: BMI 25 -29 Overweight Nutritional Risks: None Diabetes: No  Activities of Daily Living: Independent Ambulation: Independent Medication Administration: Independent Home Management: Independent  Barriers to Care Management & Learning: None  Do you feel unsafe in your current relationship?: No Do you feel physically threatened by others?: No Anyone hurting you at home, work, or school?: No Unable to ask?: No  How often do you need to have someone help you when you read instructions, pamphlets, or other written materials from your doctor or pharmacy?: 1 -  Never  Interpreter Needed?: No  GOALS Goals    . Exercise 150 min/wk Moderate Activity     Lose 10 lbs, rinks lots of water, more activity, plant based meals.     FUNCTIONAL STATUS SURVEY, EXERCISE, CARDIAC RISK FACTORS Is the patient deaf or have difficulty hearing?: No Does the patient have difficulty seeing, even when wearing glasses/contacts?: No Does the patient have difficulty concentrating, remembering, or making decisions?: No Does the patient have difficulty walking or climbing stairs?: No Does the patient have difficulty dressing or bathing?: No Does the patient have difficulty doing errands alone such as visiting a doctor's office or shopping?: No  DEPRESSION QUESTIONNAIRE Depression screen [facility] 2/9 01/25/2019 11/26/2017 11/23/2017  Decreased Interest 0 0 0  Down, Depressed, Hopeless 0 0 0  PHQ - 2 Score 0 0 0   FALL RISK Fall Risk  01/25/2019 11/26/2017 11/23/2017 10/07/2017 05/08/2016  Falls in the past year? 0 No No No  No  Comment - - - - -   COGNITIVE FUNCTION Mini-Cog - 01/25/19 0903    Normal clock drawing test?  yes    How many words correct?  3      Objective:   Vitals:   01/25/19 0849  BP: (!) 140/92  Pulse: 71  Temp: 98.6 F (37 C)  SpO2: 98%   Body mass index is 32.59 kg/m.  Physical Exam  Constitutional: She is oriented to person, place, and time. She appears well-developed and well-nourished. No distress.  HENT:  Head: Normocephalic and atraumatic.  Right Ear: External ear normal.  Left Ear: External ear normal.  Mouth/Throat: No oropharyngeal exudate.  Eyes: Pupils are equal, round, and reactive to light. Conjunctivae are normal.  Neck: Normal range of motion. Neck supple. No JVD present. No thyromegaly present.  Cardiovascular: Normal rate, regular rhythm and normal heart sounds. Exam reveals no gallop and no friction rub.  No murmur heard. Pulmonary/Chest: Effort normal and breath sounds normal. She has no wheezes. She exhibits no  tenderness.  Abdominal: Soft. Bowel sounds are normal. She exhibits no mass. There is no abdominal tenderness. There is no rebound and no guarding.  Musculoskeletal: Normal range of motion.  Lymphadenopathy:    She has no cervical adenopathy.  Neurological: She is alert and oriented to person, place, and time. She has normal reflexes.  Skin: No rash noted.  Psychiatric: She has a normal mood and affect. Her behavior is normal. Judgment and thought content normal.   Timed Get Up and Go performed: yes, taking < 12 seconds  Assessment and Plan:   This is a routine wellness examination for [name].  Immunization History  Administered Date(s) Administered  . Influenza, High Dose Seasonal PF 03/27/2017  . Influenza-Unspecified 04/29/2016  . Pneumococcal Conjugate-13 05/11/2015  . Pneumococcal Polysaccharide-23 07/29/2005, 08/05/2017  . Tdap 12/18/2011   Qualifies for Shingles Vaccine? Yes Note: Contraindications include severe allergic reaction (e.g., anaphylaxis) after a previous dose or to a vaccine component, known severe immunodeficiency (e.g., from hematologic and solid tumors, receipt of chemotherapy, congenital immunodeficiency,  long-term immunosuppressive therapy(g) or patients with HIV infection who are severely immunocompromised), and pregnancy.  Screening Tests Health Maintenance  Topic Date Due  . Hepatitis C Screening  10-Nov-1950  . FOOT EXAM  02/01/1959  . URINE MICROALBUMIN  02/01/1959  . DEXA SCAN  01/31/2014  . OPHTHALMOLOGY EXAM  05/02/2019 (Originally 02/01/1959)  . INFLUENZA VACCINE  02/04/2019  . HEMOGLOBIN A1C  07/28/2019  . MAMMOGRAM  06/27/2020  . TETANUS/TDAP  12/17/2021  . COLONOSCOPY  10/13/2028  . PNA vac Low Risk Adult  Completed   Cancer Screenings: Lung: Low Dose CT Chest recommended if Age 49-80 years, 30 pack-year currently smoking OR have quit w/in 15years. Patient does not qualify. Breast:  Up to date on Mammogram? Yes   Up to date of Bone  Density/Dexa? No, but will be getting at [facility] Colorectal: due in 2030  Advanced Directives has an advanced directive - a copy HAS NOT been provided.  I have personally reviewed and noted the following in the patient's chart:   . Medical and social history . Use of alcohol, tobacco or illicit drugs  . Current medications and supplements . Functional ability and status . Nutritional status . Physical activity . Advanced directives . List of other physicians . Hospitalizations, surgeries, and ER visits in previous 12 months . Vitals . Screenings to include cognitive, depression, and falls . Referrals and appointments  In addition, I  have reviewed and discussed with patient certain preventive protocols, quality metrics, and best practice recommendations. A personalized care plan for preventive services as well as general preventive health recommendations were provided to patient.   Briscoe Deutscher, DO

## 2019-01-25 ENCOUNTER — Encounter: Payer: Self-pay | Admitting: Family Medicine

## 2019-01-25 ENCOUNTER — Other Ambulatory Visit: Payer: Self-pay

## 2019-01-25 ENCOUNTER — Ambulatory Visit (INDEPENDENT_AMBULATORY_CARE_PROVIDER_SITE_OTHER): Payer: Medicare Other | Admitting: Family Medicine

## 2019-01-25 VITALS — BP 140/92 | HR 71 | Temp 98.6°F | Ht 63.0 in | Wt 184.0 lb

## 2019-01-25 DIAGNOSIS — Z0000 Encounter for general adult medical examination without abnormal findings: Secondary | ICD-10-CM | POA: Diagnosis not present

## 2019-01-25 DIAGNOSIS — E1169 Type 2 diabetes mellitus with other specified complication: Secondary | ICD-10-CM | POA: Diagnosis not present

## 2019-01-25 DIAGNOSIS — E669 Obesity, unspecified: Secondary | ICD-10-CM

## 2019-01-25 DIAGNOSIS — I10 Essential (primary) hypertension: Secondary | ICD-10-CM | POA: Diagnosis not present

## 2019-01-25 DIAGNOSIS — E119 Type 2 diabetes mellitus without complications: Secondary | ICD-10-CM | POA: Insufficient documentation

## 2019-01-25 DIAGNOSIS — E039 Hypothyroidism, unspecified: Secondary | ICD-10-CM

## 2019-01-25 DIAGNOSIS — E7800 Pure hypercholesterolemia, unspecified: Secondary | ICD-10-CM | POA: Diagnosis not present

## 2019-01-25 LAB — COMPREHENSIVE METABOLIC PANEL
ALT: 17 U/L (ref 0–35)
AST: 20 U/L (ref 0–37)
Albumin: 4.5 g/dL (ref 3.5–5.2)
Alkaline Phosphatase: 98 U/L (ref 39–117)
BUN: 12 mg/dL (ref 6–23)
CO2: 32 mEq/L (ref 19–32)
Calcium: 10.2 mg/dL (ref 8.4–10.5)
Chloride: 99 mEq/L (ref 96–112)
Creatinine, Ser: 0.87 mg/dL (ref 0.40–1.20)
GFR: 77.89 mL/min (ref >60.00)
Glucose, Bld: 93 mg/dL (ref 70–99)
Potassium: 4 mEq/L (ref 3.5–5.1)
Sodium: 139 mEq/L (ref 135–145)
Total Bilirubin: 0.4 mg/dL (ref 0.2–1.2)
Total Protein: 7.8 g/dL (ref 6.0–8.3)

## 2019-01-25 LAB — CBC WITH DIFFERENTIAL/PLATELET
Basophils Absolute: 0.1 10*3/uL (ref 0.0–0.1)
Basophils Relative: 2.2 % (ref 0.0–3.0)
Eosinophils Absolute: 0.2 10*3/uL (ref 0.0–0.7)
Eosinophils Relative: 3.6 % (ref 0.0–5.0)
HCT: 37.4 % (ref 36.0–46.0)
Hemoglobin: 11.6 g/dL — ABNORMAL LOW (ref 12.0–15.0)
Lymphocytes Relative: 54.8 % — ABNORMAL HIGH (ref 12.0–46.0)
Lymphs Abs: 2.8 10*3/uL (ref 0.7–4.0)
MCHC: 31.1 g/dL (ref 30.0–36.0)
MCV: 72.6 fl — ABNORMAL LOW (ref 78.0–100.0)
Monocytes Absolute: 0.4 10*3/uL (ref 0.1–1.0)
Monocytes Relative: 6.8 % (ref 3.0–12.0)
Neutro Abs: 1.7 10*3/uL (ref 1.4–7.7)
Neutrophils Relative %: 32.6 % — ABNORMAL LOW (ref 43.0–77.0)
Platelets: 351 10*3/uL (ref 150.0–400.0)
RBC: 5.15 Mil/uL — ABNORMAL HIGH (ref 3.87–5.11)
RDW: 15.4 % (ref 11.5–15.5)
WBC: 5.2 10*3/uL (ref 4.0–10.5)

## 2019-01-25 LAB — LIPID PANEL
Cholesterol: 298 mg/dL — ABNORMAL HIGH (ref 0–200)
HDL: 60.4 mg/dL (ref >39.00)
LDL Cholesterol: 211 mg/dL — ABNORMAL HIGH (ref 0–99)
NonHDL: 237.27
Total CHOL/HDL Ratio: 5
Triglycerides: 131 mg/dL (ref 0.0–149.0)
VLDL: 26.2 mg/dL (ref 0.0–40.0)

## 2019-01-25 LAB — TSH: TSH: 0.03 u[IU]/mL — ABNORMAL LOW (ref 0.35–4.50)

## 2019-01-25 LAB — T4, FREE: Free T4: 1.21 ng/dL (ref 0.60–1.60)

## 2019-01-25 LAB — VITAMIN D 25 HYDROXY (VIT D DEFICIENCY, FRACTURES): VITD: 24.68 ng/mL — ABNORMAL LOW (ref 30.00–100.00)

## 2019-01-25 LAB — HEMOGLOBIN A1C: Hgb A1c MFr Bld: 6.5 % (ref 4.6–6.5)

## 2019-01-26 ENCOUNTER — Encounter: Payer: Self-pay | Admitting: Family Medicine

## 2019-01-26 DIAGNOSIS — E669 Obesity, unspecified: Secondary | ICD-10-CM | POA: Insufficient documentation

## 2019-02-08 ENCOUNTER — Other Ambulatory Visit: Payer: Self-pay

## 2019-02-08 DIAGNOSIS — E039 Hypothyroidism, unspecified: Secondary | ICD-10-CM

## 2019-02-09 ENCOUNTER — Other Ambulatory Visit: Payer: Self-pay

## 2019-02-09 MED ORDER — LEVOTHYROXINE SODIUM 112 MCG PO TABS: 112.0000 ug | ORAL_TABLET | Freq: Every day | ORAL | 3 refills | Status: DC

## 2019-02-20 ENCOUNTER — Telehealth: Payer: Self-pay

## 2019-02-20 NOTE — Telephone Encounter (Signed)
Copied from [place] [id number]. Topic: Quick Communication - See Telephone Encounter >> Feb 20, 2019  8:39 AM Joan [name] wrote: CRM for notification. See Telephone encounter for: 02/20/19. PT took a Shringrx  shot in Nov of 2019, she forgot to go back. Pharmacy told her to chk with her primary to see if she could do the 2nd one or start. [phone]

## 2019-03-27 ENCOUNTER — Other Ambulatory Visit: Payer: Self-pay

## 2019-03-27 ENCOUNTER — Other Ambulatory Visit (INDEPENDENT_AMBULATORY_CARE_PROVIDER_SITE_OTHER): Payer: Medicare Other

## 2019-03-27 DIAGNOSIS — E039 Hypothyroidism, unspecified: Secondary | ICD-10-CM | POA: Diagnosis not present

## 2019-03-27 LAB — CBC WITH DIFFERENTIAL/PLATELET
Basophils Absolute: 0.1 10*3/uL (ref 0.0–0.1)
Basophils Relative: 1 % (ref 0.0–3.0)
Eosinophils Absolute: 0.2 10*3/uL (ref 0.0–0.7)
Eosinophils Relative: 3.9 % (ref 0.0–5.0)
HCT: 36.6 % (ref 36.0–46.0)
Hemoglobin: 11.5 g/dL — ABNORMAL LOW (ref 12.0–15.0)
Lymphocytes Relative: 62.8 % — ABNORMAL HIGH (ref 12.0–46.0)
Lymphs Abs: 3.5 10*3/uL (ref 0.7–4.0)
MCHC: 31.3 g/dL (ref 30.0–36.0)
MCV: 71.9 fl — ABNORMAL LOW (ref 78.0–100.0)
Monocytes Absolute: 0.4 10*3/uL (ref 0.1–1.0)
Monocytes Relative: 6.6 % (ref 3.0–12.0)
Neutro Abs: 1.4 10*3/uL (ref 1.4–7.7)
Neutrophils Relative %: 25.7 % — ABNORMAL LOW (ref 43.0–77.0)
Platelets: 312 10*3/uL (ref 150.0–400.0)
RBC: 5.09 Mil/uL (ref 3.87–5.11)
RDW: 16.1 % — ABNORMAL HIGH (ref 11.5–15.5)
WBC: 5.5 10*3/uL (ref 4.0–10.5)

## 2019-03-27 LAB — TSH: TSH: <0.01 u[IU]/mL — ABNORMAL LOW (ref 0.35–4.50)

## 2019-03-27 LAB — T4, FREE: Free T4: 1.55 ng/dL (ref 0.60–1.60)

## 2019-03-27 MED ORDER — LEVOTHYROXINE SODIUM 100 MCG PO TABS: 100.0000 ug | ORAL_TABLET | Freq: Every day | ORAL | 3 refills | Status: DC

## 2019-03-28 LAB — IRON,TIBC AND FERRITIN PANEL
%SAT: 23 % (calc) (ref 16–45)
Ferritin: 130 ng/mL (ref 16–288)
Iron: 87 ug/dL (ref 45–160)
TIBC: 378 mcg/dL (calc) (ref 250–450)

## 2019-04-26 ENCOUNTER — Ambulatory Visit (INDEPENDENT_AMBULATORY_CARE_PROVIDER_SITE_OTHER): Payer: Medicare Other | Admitting: Family Medicine

## 2019-04-26 ENCOUNTER — Encounter: Payer: Self-pay | Admitting: Family Medicine

## 2019-04-26 ENCOUNTER — Other Ambulatory Visit: Payer: Self-pay

## 2019-04-26 VITALS — BP 146/80 | HR 67 | Temp 97.6°F | Ht 63.0 in | Wt 189.8 lb

## 2019-04-26 DIAGNOSIS — E1169 Type 2 diabetes mellitus with other specified complication: Secondary | ICD-10-CM | POA: Diagnosis not present

## 2019-04-26 DIAGNOSIS — Z1159 Encounter for screening for other viral diseases: Secondary | ICD-10-CM

## 2019-04-26 DIAGNOSIS — E7800 Pure hypercholesterolemia, unspecified: Secondary | ICD-10-CM

## 2019-04-26 DIAGNOSIS — D509 Iron deficiency anemia, unspecified: Secondary | ICD-10-CM | POA: Diagnosis not present

## 2019-04-26 DIAGNOSIS — E039 Hypothyroidism, unspecified: Secondary | ICD-10-CM

## 2019-04-26 DIAGNOSIS — I10 Essential (primary) hypertension: Secondary | ICD-10-CM

## 2019-04-26 DIAGNOSIS — Z9189 Other specified personal risk factors, not elsewhere classified: Secondary | ICD-10-CM

## 2019-04-26 DIAGNOSIS — E669 Obesity, unspecified: Secondary | ICD-10-CM

## 2019-04-26 LAB — CBC WITH DIFFERENTIAL/PLATELET
Basophils Absolute: 0 10*3/uL (ref 0.0–0.1)
Basophils Relative: 0.8 % (ref 0.0–3.0)
Eosinophils Absolute: 0.2 10*3/uL (ref 0.0–0.7)
Eosinophils Relative: 3.2 % (ref 0.0–5.0)
HCT: 34 % — ABNORMAL LOW (ref 36.0–46.0)
Hemoglobin: 10.6 g/dL — ABNORMAL LOW (ref 12.0–15.0)
Lymphocytes Relative: 55.6 % — ABNORMAL HIGH (ref 12.0–46.0)
Lymphs Abs: 2.8 10*3/uL (ref 0.7–4.0)
MCHC: 31.2 g/dL (ref 30.0–36.0)
MCV: 72.7 fl — ABNORMAL LOW (ref 78.0–100.0)
Monocytes Absolute: 0.4 10*3/uL (ref 0.1–1.0)
Monocytes Relative: 6.9 % (ref 3.0–12.0)
Neutro Abs: 1.7 10*3/uL (ref 1.4–7.7)
Neutrophils Relative %: 33.5 % — ABNORMAL LOW (ref 43.0–77.0)
Platelets: 305 10*3/uL (ref 150.0–400.0)
RBC: 4.67 Mil/uL (ref 3.87–5.11)
RDW: 15 % (ref 11.5–15.5)
WBC: 5.1 10*3/uL (ref 4.0–10.5)

## 2019-04-26 LAB — MICROALBUMIN / CREATININE URINE RATIO
Creatinine,U: 40.9 mg/dL
Microalb Creat Ratio: 6.5 mg/g (ref 0.0–30.0)
Microalb, Ur: 2.6 mg/dL — ABNORMAL HIGH (ref 0.0–1.9)

## 2019-04-26 LAB — T4, FREE: Free T4: 1.22 ng/dL (ref 0.60–1.60)

## 2019-04-26 LAB — TSH: TSH: 0.01 u[IU]/mL — ABNORMAL LOW (ref 0.35–4.50)

## 2019-04-26 NOTE — Progress Notes (Addendum)
[name] Goldberg is a 68 y.o. female is here for follow up.  History of Present Illness:   HPI: See Assessment and Plan section for Problem Based Charting of issues discussed today.   Health Maintenance Due  Topic Date Due  . Hepatitis C Screening  [birth year]  . FOOT EXAM  02/01/1959  . URINE MICROALBUMIN  02/01/1959  . DEXA SCAN  01/31/2014   Depression screen [facility] 2/9 01/25/2019 11/26/2017 11/23/2017  Decreased Interest 0 0 0  Down, Depressed, Hopeless 0 0 0  PHQ - 2 Score 0 0 0   PMHx, SurgHx, SocialHx, FamHx, Medications, and Allergies were reviewed in the Visit Navigator and updated as appropriate.   Patient Active Problem List   Diagnosis Date Noted  . Microcytic anemia 04/26/2019  . Obesity (BMI 30-39.9) 01/26/2019  . Diabetes mellitus ([place]) 01/25/2019  . HTN (hypertension), Rx HCTZ 12/30/2012  . Pure hypercholesterolemia 12/30/2012  . Hypothyroidism, Rx Levothyroxine 12/30/2012   Social History   Tobacco Use  . Smoking status: Never Smoker  . Smokeless tobacco: Never Used  Substance Use Topics  . Alcohol use: No  . Drug use: No   Current Medications and Allergies   Current Outpatient Medications:  .  cetirizine (ZYRTEC) 10 MG tablet, Take 10 mg by mouth daily., Disp: , Rfl:  .  cholecalciferol (VITAMIN D) 1000 units tablet, Take 1,000 Units by mouth daily., Disp: , Rfl:  .  hydrochlorothiazide (MICROZIDE) 12.5 MG capsule, Take 12.5 mg by mouth daily., Disp: , Rfl:  .  levothyroxine (SYNTHROID) 100 MCG tablet, Take 1 tablet (100 mcg total) by mouth daily., Disp: 90 tablet, Rfl: 3 .  Omega-3 Fatty Acids (OMEGA 3 500 PO), Take by mouth., Disp: , Rfl:  .  Turmeric 500 MG TABS, Take by mouth., Disp: , Rfl:  .  vitamin B-12 (CYANOCOBALAMIN) 500 MCG tablet, Take 500 mcg by mouth daily., Disp: , Rfl:    Allergies  Allergen Reactions  . Penicillins Hives   Review of Systems   Pertinent items are noted in the HPI. Otherwise, a complete ROS is negative.  Vitals    Vitals:   04/26/19 0827  BP: (!) 146/80  Pulse: 67  Temp: 97.6 F (36.4 C)  TempSrc: Temporal  SpO2: 98%  Weight: 189 lb 12.8 oz (86.1 kg)  Height: 5\' 3"  (1.6 m)     Body mass index is 33.62 kg/m.  Physical Exam   Physical Exam Vitals signs and nursing note reviewed.  HENT:     Head: Normocephalic and atraumatic.  Eyes:     Pupils: Pupils are equal, round, and reactive to light.  Neck:     Musculoskeletal: Normal range of motion and neck supple.  Cardiovascular:     Rate and Rhythm: Normal rate and regular rhythm.     Heart sounds: Normal heart sounds.  Pulmonary:     Effort: Pulmonary effort is normal.  Abdominal:     Palpations: Abdomen is soft.  Skin:    General: Skin is warm.  Psychiatric:        Behavior: Behavior normal.    Diabetic Foot Exam - Simple   Simple Foot Form Diabetic Foot exam was performed with the following findings: Yes 04/26/2019  9:05 AM  Visual Inspection No deformities, no ulcerations, no other skin breakdown bilaterally: Yes Sensation Testing Intact to touch and monofilament testing bilaterally: Yes Pulse Check Posterior Tibialis and Dorsalis pulse intact bilaterally: Yes Comments     Assessment and [place]  was seen today for follow-up.  Diagnoses and all orders for this visit:  Acquired hypothyroidism Comments: Overtreated, with dose decreased 6 weeks ago. Recheck today. Orders: -     TSH -     T4, free  Microcytic anemia -     Iron, TIBC and Ferritin Panel -     CBC with Differential/Platelet  Type 2 diabetes mellitus with other specified complication, without long-term current use of insulin (HCC) Comments: Stable. Orders: -     Microalbumin / creatinine urine ratio  Essential hypertension Comments: Stable.   Pure hypercholesterolemia Comments: Worsened at last lab draw.   Obesity, BMI 33.62 Comments: Patient will be following up at [facility] after the new year.   Encounter  for hepatitis C virus screening test for high risk patient -     Hepatitis C antibody   . Orders and follow up as documented in [place], reviewed diet, exercise and weight control, cardiovascular risk and specific lipid/LDL goals reviewed, reviewed medications and side effects in detail.  . Reviewed expectations re: course of current medical issues. . Outlined signs and symptoms indicating need for more acute intervention. . Patient verbalized understanding and all questions were answered. . Patient received an After Visit Summary.  Briscoe Deutscher, DO [place], [place] [facility] 04/26/2019

## 2019-04-27 ENCOUNTER — Telehealth: Payer: Self-pay

## 2019-04-27 LAB — IRON,TIBC AND FERRITIN PANEL
%SAT: 22 % (calc) (ref 16–45)
Ferritin: 121 ng/mL (ref 16–288)
Iron: 76 ug/dL (ref 45–160)
TIBC: 342 mcg/dL (calc) (ref 250–450)

## 2019-04-27 LAB — HEPATITIS C ANTIBODY
Hepatitis C Ab: NONREACTIVE
SIGNAL TO CUT-OFF: 0.03 (ref <1.00)

## 2019-04-27 NOTE — Telephone Encounter (Signed)
Travel or Contacts:   Any travel in the past 2 weeks?  No  Have you came in contact with anyone who has Covid? No  Have you had a positive Covid test? No  Fever >100.26F []   Yes [x]   No []   Unknown  Chills []   Yes [x]   No []   Unknown Muscle aches (myalgia) []   Yes [x]   No []   Unknown Runny nose (rhinorrhea) []   Yes [x]   No []   Unknown Sore throat []   Yes [x]   No []   Unknown Cough (new onset or worsening of chronic cough) []   Yes [x]   No []   Unknown Shortness of breath (dyspnea) []   Yes [x]   No []   Unknown Nausea or vomiting []   Yes [x]   No []   Unknown Headache []   Yes [x]   No []   Unknown Abdominal pain  []   Yes [x]   No []   Unknown Diarrhea (?3 loose/looser than normal stools/24hr period) []   Yes [x]   No []   Unknown

## 2019-04-27 NOTE — Patient Instructions (Signed)
Health Maintenance Due  Topic Date Due  . Hepatitis C Screening  11-Jun-1951  . DEXA SCAN  01/31/2014    Depression screen [facility] 2/9 01/25/2019 11/26/2017 11/23/2017  Decreased Interest 0 0 0  Down, Depressed, Hopeless 0 0 0  PHQ - 2 Score 0 0 0

## 2019-04-28 ENCOUNTER — Other Ambulatory Visit: Payer: Self-pay

## 2019-04-28 ENCOUNTER — Encounter (INDEPENDENT_AMBULATORY_CARE_PROVIDER_SITE_OTHER): Payer: Medicare Other | Admitting: Family Medicine

## 2019-04-28 ENCOUNTER — Ambulatory Visit: Payer: Medicare Other | Admitting: Family Medicine

## 2019-04-28 ENCOUNTER — Encounter: Payer: Self-pay | Admitting: Family Medicine

## 2019-04-28 VITALS — BP 160/90 | HR 67 | Temp 98.1°F | Ht 63.75 in | Wt 190.0 lb

## 2019-04-28 DIAGNOSIS — I10 Essential (primary) hypertension: Secondary | ICD-10-CM

## 2019-04-28 DIAGNOSIS — E039 Hypothyroidism, unspecified: Secondary | ICD-10-CM

## 2019-04-28 DIAGNOSIS — Z7689 Persons encountering health services in other specified circumstances: Secondary | ICD-10-CM | POA: Diagnosis not present

## 2019-04-28 DIAGNOSIS — E119 Type 2 diabetes mellitus without complications: Secondary | ICD-10-CM | POA: Diagnosis not present

## 2019-04-28 DIAGNOSIS — D509 Iron deficiency anemia, unspecified: Secondary | ICD-10-CM

## 2019-04-28 MED ORDER — LEVOTHYROXINE SODIUM 75 MCG PO TABS: 75.0000 ug | ORAL_TABLET | Freq: Every day | ORAL | 3 refills | Status: DC

## 2019-04-28 NOTE — Progress Notes (Signed)
error 

## 2019-04-28 NOTE — Patient Instructions (Signed)
Health Maintenance Due  Topic Date Due  . DEXA SCAN  Pt will inquire with [facility] about Bone Density test.  01/31/2014    Depression screen [facility] 2/9 01/25/2019 11/26/2017 11/23/2017  Decreased Interest 0 0 0  Down, Depressed, Hopeless 0 0 0  PHQ - 2 Score 0 0 0

## 2019-04-28 NOTE — Progress Notes (Signed)
Joan [name] is a 68 y.o. female  Chief Complaint  Patient presents with  . Establish Care    HPI: Joan [name] is a 68 y.o. female here as a transfer of care from Dr. Juleen China at [facility]. Pt was late today as she got lost finding office and ended up at [facility] at the other end of [place] from our office. She is pleasant, happy but a bit flustered. She was last seen by Dr. Juleen China 2 days ago on 04/26/19. She had labs done at that time. She also gets care thru the [place].  Immunizations UTD including flu vaccine.  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  Last mammo: 06/2018 - has one scheduled for 06/2019 thru the [place] Last Dexa: she will have this done thru [place] Last colonoscopy: had cologuard done 10/2018 - negative  No med refills needed at this time.  Last annual exam in 01/2019.   Pt has PMHx significant for hypothyroidism, HTN, hyperlipidemia, DM. Last A1C = 6.5 in 01/2019. Thyroid has been "overtreated" as per pt and previous PCP lowering levothyroxine dose over the past couple of months.   Past Medical History:  Diagnosis Date  . Allergic rhinitis   . Hyperlipidemia   . Hypertension   . Thyroid disease     Past Surgical History:  Procedure Laterality Date  . SPINE SURGERY      Social History   Socioeconomic History  . Marital status: Married    Spouse name: Not on file  . Number of children: 0  . Years of education: Not on file  . Highest education level: Master's degree (e.g., MA, MS, MEng, MEd, MSW, MBA)  Occupational History    Employer: [employer]  Social Needs  . Financial resource strain: Not very hard  . Food insecurity    Worry: Never true    Inability: Never true  . Transportation needs    Medical: No    Non-medical: No  Tobacco Use  . Smoking status: Never Smoker  . Smokeless tobacco: Never Used  Substance and Sexual Activity  . Alcohol  use: No  . Drug use: No  . Sexual activity: Yes    Partners: Male    Birth control/protection: None  Lifestyle  . Physical activity    Days per week: 2 days    Minutes per session: 30 min  . Stress: Not at all  Relationships  . Social connections    Talks on phone: More than three times a week    Gets together: Twice a week    Attends religious service: More than 4 times per year    Active member of club or organization: Yes    Attends meetings of clubs or organizations: More than 4 times per year    Relationship status: Married  . Intimate partner violence    Fear of current or ex partner: No    Emotionally abused: No    Physically abused: No    Forced sexual activity: No  Other Topics Concern  . 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 Married, no children. Adopted at 27 y/o - difficult years.     Family History  Problem Relation Age of Onset  . Heart disease Mother   . Diabetes Sister      Immunization History  Administered Date(s) Administered  . Influenza, High Dose Seasonal PF 03/27/2017, 05/08/2018, 03/31/2019  . Influenza-Unspecified 04/29/2016  . Pneumococcal Conjugate-13 05/11/2015  . Pneumococcal Polysaccharide-23 07/29/2005, 08/05/2017  . Tdap 12/18/2011  . Zoster Recombinat (Shingrix) 05/08/2018, 03/31/2019    Outpatient Encounter Medications as of 04/28/2019  Medication Sig Note  . cetirizine (ZYRTEC) 10 MG tablet Take 10 mg by mouth daily.   . hydrochlorothiazide (MICROZIDE) 12.5 MG capsule Take 12.5 mg by mouth daily.   Marland Kitchen levothyroxine (SYNTHROID) 100 MCG tablet Take 1 tablet (100 mcg total) by mouth daily.   . cholecalciferol (VITAMIN D) 1000 units tablet Take 1,000 Units by mouth daily. 10/07/2017: [facility] 5000 u daily   . [DISCONTINUED] Omega-3 Fatty Acids (OMEGA 3 500 PO) Take by mouth. 10/07/2017: Purity products 325mg   . [DISCONTINUED]  Turmeric 500 MG TABS Take by mouth.   . [DISCONTINUED] vitamin B-12 (CYANOCOBALAMIN) 500 MCG tablet Take 500 mcg by mouth daily.    No facility-administered encounter medications on file as of 04/28/2019.      ROS: Gen: no fever, chills  Skin: no rash, itching ENT: no ear pain, ear drainage, nasal congestion, rhinorrhea, sinus pressure, sore throat Eyes: no blurry vision, double vision Resp: no cough, wheeze,SOB CV: no CP, palpitations, LE edema,  GI: no heartburn, n/v/d/c, abd pain MSK: no joint pain, myalgias, back pain Neuro: no dizziness, headache, weakness Psych: no depression, anxiety, insomnia   Allergies  Allergen Reactions  . Penicillins Hives    BP (!) 160/90 (BP Location: Left Arm, Patient Position: Sitting, Cuff Size: Normal)   Pulse 67   Temp 98.1 F (36.7 C) (Oral)   Ht 5' 3.75" (1.619 m)   Wt 190 lb (86.2 kg)   SpO2 99%   BMI 32.87 kg/m   BP Readings from Last 3 Encounters:  04/28/19 (!) 160/90  04/26/19 (!) 146/80  01/25/19 (!) 140/92    Physical Exam  Constitutional: She is oriented to person, place, and time. She appears well-developed and well-nourished.  Neck: Neck supple. No thyromegaly present.  Cardiovascular: Normal rate, regular rhythm and intact distal pulses.  Pulmonary/Chest: Effort normal and breath sounds normal. No respiratory distress.  Musculoskeletal:        General: No edema.  Neurological: She is alert and oriented to person, place, and time.  Psychiatric: She has a normal mood and affect. Her behavior is normal.     A/P:  1. Encounter to establish care with new doctor - annual exam, labs done in 01/2019 - will plan on 01/2020 - pt also gets care at [facility]  2. Essential hypertension - not at goal and more elevated than her baseline today - recommend increasing HCTZ from 12.5mg  to 25mg  today but pt declines and feels BP is elevated due to stress of getting lost/being late for appt - DASH diet, cont walking and encouraged pt to  increase frequency - f/u in 3 mo - if BP still above goal, pt will need higher dose of med  3. Type 2 diabetes mellitus without complication, without long-term current use of insulin (HCC) - A1C = 6.5 in 01/2019 - not on medication, also not on statin, ASA, ACE/ARB - normal renal function - foot exam UTD - f/u in 3 mo - will recheck A1C at that time  4. Acquired hypothyroidism - currently on levothyroxine 120mcg daily - dose decrease from 160mcg in 03/2019 - most recent labs show persistently low TSH despite decreased dose of levothyroxine, normal T4. Pt states she takes biotin  supplement for her nails and has done so for years. - previous PCP notes indicate referral to endo for further work-up but pt states she has not yet spoken with previous PCP's office. She would like to hear from them regarding labs, recs before moving ahead with referral, imaging. I told pt I would cc Dr. Juleen China on this OV note so she is aware pt established care with me and to confirm her recs regarding pts recent labs

## 2019-05-01 ENCOUNTER — Other Ambulatory Visit: Payer: Self-pay

## 2019-05-01 ENCOUNTER — Encounter: Payer: Self-pay | Admitting: Nurse Practitioner

## 2019-05-01 ENCOUNTER — Ambulatory Visit (INDEPENDENT_AMBULATORY_CARE_PROVIDER_SITE_OTHER): Payer: Medicare Other | Admitting: Nurse Practitioner

## 2019-05-01 VITALS — BP 128/84 | HR 70 | Temp 97.4°F | Ht 63.0 in | Wt 185.0 lb

## 2019-05-01 DIAGNOSIS — Z1159 Encounter for screening for other viral diseases: Secondary | ICD-10-CM

## 2019-05-01 DIAGNOSIS — D509 Iron deficiency anemia, unspecified: Secondary | ICD-10-CM | POA: Diagnosis not present

## 2019-05-01 MED ORDER — NA SULFATE-K SULFATE-MG SULF 17.5-3.13-1.6 GM/177ML PO SOLN: ORAL | 0 refills | Status: DC

## 2019-05-01 NOTE — Progress Notes (Signed)
ASSESSMENT / PLAN:    68 yo female with chronic microcytic anemia without evidence for iron deficiency. Slow drift in hgb by one gram from 11.7 to 10.6 since August 2019. No overt GI bleeding. Negative FIT. No previous endoscopic workup per patient but she describes what sounds like an upper GI many years ago.  -Though her anemia is chronic and there is no evidence for iron deficiency, hemoglobin has drifted over the last year or so.  Further evaluation patient will be scheduled for EGD and colonoscopy. The risks and benefits of EGD and colonoscopy with possible polypectomy / biopsies were discussed and the patient agrees to proceed.  -If EGD / colonoscopy negative consider Hematology evaluation.   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 She used to take iron but she didn't like that they turned her stools black and did not seem to help any way.  Patient says she is never had endoscopic work-up but recalls having what sounds like may have been an upper GI at one time many years ago.  She had a negative FIT test a few months ago [facility]).  She doesn't take NSAIDs. She has not had any overt bleeding.  Bowel movements are normal.  No abdominal pain.  No unusual weight loss.  No family history of colon cancer.   Data Reviewed:  04/26/2019 Hemoglobin 10.54m MCV 72, platelets 305 Ferritin 121,  TIBC 342,  percent saturation 22  FOBT - 10/10/2018   Past Medical History:  Diagnosis Date  . Allergic rhinitis   . Hyperlipidemia   . Hypertension   . Thyroid disease     Past Surgical History:  Procedure Laterality Date  . SPINE SURGERY     Family History  Problem Relation Age of Onset  . Heart disease Mother   .  Diabetes Sister    Social History   Tobacco Use  . Smoking status: Never Smoker  . Smokeless tobacco: Never Used  Substance Use Topics  . Alcohol use: No  . Drug use: No   Current Outpatient Medications  Medication Sig Dispense Refill  . cetirizine (ZYRTEC) 10 MG tablet Take 10 mg by mouth daily.    . Cholecalciferol (DIALYVITE VITAMIN D 5000 PO) Take 1 capsule by mouth daily.    . hydrochlorothiazide (MICROZIDE) 12.5 MG capsule Take 12.5 mg by mouth daily.    Marland Kitchen levothyroxine (SYNTHROID) 75 MCG tablet Take 1 tablet (75 mcg total) by mouth daily. 90 tablet 3   No current facility-administered medications for this visit.    Allergies  Allergen Reactions  . Penicillins Hives     Review of Systems: All systems reviewed and negative except where noted in HPI.   Physical Exam:    Wt Readings from Last 3 Encounters:  05/01/19 185 lb (83.9 kg)  04/28/19 190 lb (86.2 kg)  04/26/19 189 lb 12.8 oz (86.1 kg)    BP 128/84   Pulse 70   Temp (!) 97.4 F (36.3 C) (Temporal)   Ht 5\' 3"  (1.6 m)   Wt 185 lb (83.9 kg)   BMI 32.77 kg/m  Constitutional:  Pleasant female in no acute distress. Psychiatric: Normal mood and affect. Behavior is normal. EENT: Pupils normal.  Conjunctivae are normal. No scleral icterus. Neck supple.  Cardiovascular: Normal rate, regular rhythm. No edema Pulmonary/chest: Effort normal and breath sounds normal. No wheezing, rales or rhonchi. Abdominal: Soft, nondistended, nontender. Bowel sounds active throughout. There are no masses palpable. No hepatomegaly. Neurological: Alert and oriented to person place and time. Skin: Skin is warm and dry. No rashes noted.  Joan Savoy, Joan [name]  05/01/2019, 8:32 AM  Cc: Ronnald Nian, DO

## 2019-05-01 NOTE — Progress Notes (Signed)
Assessment and plan reviewed 

## 2019-05-01 NOTE — Patient Instructions (Signed)
If you are age 70 or older, your body mass index should be between 23-30. Your Body mass index is 32.77 kg/m. If this is out of the aforementioned range listed, please consider follow up with your Primary Care Provider.  If you are age 71 or younger, your body mass index should be between 19-25. Your Body mass index is 32.77 kg/m. If this is out of the aformentioned range listed, please consider follow up with your Primary Care Provider.   You have been scheduled for an endoscopy and colonoscopy. Please follow the written instructions given to you at your visit today. Please pick up your prep supplies at the pharmacy within the next 1-3 days. If you use inhalers (even only as needed), please bring them with you on the day of your procedure. Your physician has requested that you go to www.startemmi.com and enter the access code given to you at your visit today. This web site gives a general overview about your procedure. However, you should still follow specific instructions given to you by our office regarding your preparation for the procedure.  We have sent the following medications to your pharmacy for you to pick up at your convenience: Suprep  Thank you for choosing me and [facility].   Tye Savoy, NP

## 2019-05-05 ENCOUNTER — Telehealth: Payer: Self-pay | Admitting: *Deleted

## 2019-05-05 ENCOUNTER — Telehealth: Payer: Self-pay | Admitting: Family Medicine

## 2019-05-05 NOTE — Telephone Encounter (Signed)
Patient called and wants to give her PCP hemoglobin results from the [place] which is hgb A1c 6.7h. If there are any questions please call patient back, thanks.

## 2019-05-05 NOTE — Telephone Encounter (Signed)
Recent A1C = 6.7 noted. Thank you

## 2019-05-05 NOTE — Telephone Encounter (Signed)
Pt has questions in to regards to her anemia? I don't see anything in her chart except for her visit with GI. She's only seen Dr. Loletha Grayer once.

## 2019-05-05 NOTE — Telephone Encounter (Signed)
This pt called in with questions regarding her hemoglobin.   She was a pt of Dr. Hermelinda Medicus at the [facility] practice.   She has transferred to Dr. Letta Median since Dr. Juleen China is no longer with the practice.  The pt is sure someone from the [facility] location called her this morning about a hemoglobin.  I was unable to locate what she was referring to.  She went into a long story [facility] and was told to get a PCP.    She did that with Dr. Juleen China but she is sure someone from Dr. Alcario Drought office not Dr. Bryan Lemma called her this morning.    I checked with [facility] location and did not know anything about a call from them.     Her care has been transferred to Dr. Bryan Lemma.  I called [facility] they are going to follow up on this and call the pt back.  I let the pt know this.    She was agreeable to talking with the [place] practice where Dr. Bryan Lemma is when they call back though she kept telling me it was not Dr. Vivia Ewing office that called.   I let her know to discuss it further when they call her back as I wasn't able to assist her an

## 2019-05-24 ENCOUNTER — Other Ambulatory Visit: Payer: Self-pay

## 2019-05-26 ENCOUNTER — Ambulatory Visit: Payer: Medicare Other | Admitting: Endocrinology

## 2019-05-30 ENCOUNTER — Other Ambulatory Visit: Payer: Self-pay

## 2019-05-30 LAB — HM DIABETES EYE EXAM

## 2019-06-06 ENCOUNTER — Other Ambulatory Visit: Payer: Self-pay

## 2019-06-08 ENCOUNTER — Encounter: Payer: Self-pay | Admitting: Endocrinology

## 2019-06-08 ENCOUNTER — Ambulatory Visit (INDEPENDENT_AMBULATORY_CARE_PROVIDER_SITE_OTHER): Payer: Medicare Other | Admitting: Endocrinology

## 2019-06-08 ENCOUNTER — Encounter: Payer: Self-pay | Admitting: Internal Medicine

## 2019-06-08 VITALS — BP 132/80 | HR 89 | Ht 63.0 in | Wt 190.2 lb

## 2019-06-08 DIAGNOSIS — E039 Hypothyroidism, unspecified: Secondary | ICD-10-CM

## 2019-06-08 NOTE — Progress Notes (Signed)
Subjective:    Patient ID: Joan [name], female    DOB: [birth year], 68 y.o.   MRN: [id number]  HPI Pt is referred by Dr Bryan Lemma, for hypothyroidism.  Pt reports hypothyroidism was dx'ed in 2010.  She has been going to the [place] for this until 2020.  she has been on prescribed thyroid hormone therapy since dx.  she has never taken kelp or any other type of non-prescribed thyroid product.  she has never had thyroid imaging.  She has never had thyroid surgery, or XRT to the neck.  she has never been on amiodarone or lithium.  She takes biotin.  She reports mild anxiety, but no assoc fatigue.  She took synthroid 125 mcg/d x many years.  Pt says as far as she knew, labs were normal on this dosage.  In 2020, this was reduced to 112, 100, then to 75/d, due to low TSH.  She has been on 75/d, x 1 month.  Past Medical History:  Diagnosis Date   Allergic rhinitis    Hyperlipidemia    Hypertension    Thyroid disease     Past Surgical History:  Procedure Laterality Date   SPINE SURGERY      Social History   Socioeconomic History   Marital status: Married    Spouse name: Not on file   Number of children: 0   Years of education: Not on file   Highest education level: Master's degree (e.g., MA, MS, MEng, MEd, MSW, MBA)  Occupational History    Employer: [employer]  Social Needs   Financial resource strain: Not very hard   Food insecurity    Worry: Never true    Inability: Never true   Transportation needs    Medical: No    Non-medical: No  Tobacco Use   Smoking status: Never Smoker   Smokeless tobacco: Never Used  Substance and Sexual Activity   Alcohol use: No   Drug use: No   Sexual activity: Yes    Partners: Male    Birth control/protection: None  Lifestyle   Physical activity    Days per week: 2 days    Minutes per session: 30 min   Stress: Not at all  Relationships   Social connections    Talks on phone: More than three times a week     Gets together: Twice a week    Attends religious service: More than 4 times per year    Active member of club or organization: Yes    Attends meetings of clubs or organizations: More than 4 times per year    Relationship status: Married   Intimate partner violence    Fear of current or ex partner: No    Emotionally abused: No    Physically abused: No    Forced sexual activity: No  Other Topics Concern   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 Married, no children. Adopted at 54 y/o - difficult years.     Current Outpatient Medications on File Prior to Visit  Medication Sig Dispense Refill   cetirizine (ZYRTEC) 10 MG tablet Take 10 mg by mouth daily.     Cholecalciferol (DIALYVITE VITAMIN D 5000 PO) Take 1 capsule by mouth daily.     hydrochlorothiazide (MICROZIDE) 12.5 MG capsule Take 12.5 mg by mouth daily.     levothyroxine (SYNTHROID) 75 MCG tablet Take 1 tablet (  75 mcg total) by mouth daily. 90 tablet 3   Na Sulfate-K Sulfate-Mg Sulf 17.5-3.13-1.6 GM/177ML SOLN Suprep-Use as directed 354 mL 0   No current facility-administered medications on file prior to visit.     Allergies  Allergen Reactions   Penicillins Hives    Family History  Problem Relation Age of Onset   Heart disease Mother    Diabetes Sister    Thyroid disease Neg Hx     BP 132/80 (BP Location: Left Arm, Patient Position: Sitting, Cuff Size: Normal)    Pulse 89    Ht 5\' 3"  (1.6 m)    Wt 190 lb 3.2 oz (86.3 kg)    SpO2 99%    BMI 33.69 kg/m     Review of Systems denies depression, hair loss, muscle cramps, sob, weight gain, memory loss, constipation, numbness, blurry vision, cold intolerance, myalgias, dry skin, rhinorrhea, easy bruising, and syncope.      Objective:   Physical Exam VS: see vs page GEN: no distress [name]: [name]: no deformity eyes: no periorbital swelling, no  proptosis external nose and ears are normal NECK: supple, thyroid is not enlarged CHEST WALL: no deformity LUNGS: clear to auscultation CV: reg rate and rhythm, no murmur ABD: abdomen is soft, nontender.  no hepatosplenomegaly.  not distended.  no hernia MUSCULOSKELETAL: muscle bulk and strength are grossly normal.  no obvious joint swelling.  gait is normal and steady EXTEMITIES: no deformity.  no ulcer on the feet.  feet are of normal color and temp.  Trace bilat leg edema PULSES: dorsalis pedis intact bilat.  no carotid bruit NEURO:  cn 2-12 grossly intact.   readily moves all 4's.  sensation is intact to touch on the feet SKIN:  Normal texture and temperature.  No rash or suspicious lesion is visible.   NODES:  None palpable at the neck PSYCH: alert, well-oriented.  Does not appear anxious nor depressed.  Lab Results  Component Value Date   TSH 1.32 06/08/2019    I have reviewed outside records, and summarized: Pt was noted to have low TSH, and referred here.  She has had several synthroid dosage reductions, due to low TSH values      Assessment & Plan:  Hypothyroidism, new to me: well-replaced now. Uncertain if she was oversupplemented in the past, or is she having renewed endogenous thyroid function.   Anxiety: not thyroid-related  Patient Instructions  Blood tests are requested for you today.  We'll let you know about the results.   Please come back for a follow-up appointment in January.        Hypothyroidism  Hypothyroidism is when the thyroid gland does not make enough of certain hormones (it is underactive). The thyroid gland is a small gland located in the lower front part of the neck, just in front of the windpipe (trachea). This gland makes hormones that help control how the body uses food for energy (metabolism) as well as how the heart and brain function. These hormones also play a role in keeping your bones strong. When the thyroid is underactive, it produces  too little of the hormones thyroxine (T4) and triiodothyronine (T3). What are the causes? This condition may be caused by:  Hashimoto's disease. This is a disease in which the body's disease-fighting system (immune system) attacks the thyroid gland. This is the most common cause.  Viral infections.  Pregnancy.  Certain medicines.  Birth defects.  Past radiation treatments to the [name] or neck for cancer.  Past treatment with radioactive iodine.  Past exposure to radiation in the environment.  Past surgical removal of part or all of the thyroid.  Problems with a gland in the center of the brain (pituitary gland).  Lack of enough iodine in the diet. What increases the risk? You are more likely to develop this condition if:  You are female.  You have a family history of thyroid conditions.  You use a medicine called lithium.  You take medicines that affect the immune system (immunosuppressants). What are the signs or symptoms? Symptoms of this condition include:  Feeling as though you have no energy (lethargy).  Not being able to tolerate cold.  Weight gain that is not explained by a change in diet or exercise habits.  Lack of appetite.  Dry skin.  Coarse hair.  Menstrual irregularity.  Slowing of thought processes.  Constipation.  Sadness or depression. How is this diagnosed? This condition may be diagnosed based on:  Your symptoms, your medical history, and a physical exam.  Blood tests. You may also have imaging tests, such as an ultrasound or MRI. How is this treated? This condition is treated with medicine that replaces the thyroid hormones that your body does not make. After you begin treatment, it may take several weeks for symptoms to go away. Follow these instructions at home:  Take over-the-counter and prescription medicines only as told by your health care provider.  If you start taking any new medicines, tell your health care  provider.  Keep all follow-up visits as told by your health care provider. This is important. ? As your condition improves, your dosage of thyroid hormone medicine may change. ? You will need to have blood tests regularly so that your health care provider can monitor your condition. Contact a health care provider if:  Your symptoms do not get better with treatment.  You are taking thyroid replacement medicine and you: ? Sweat a lot. ? Have tremors. ? Feel anxious. ? Lose weight rapidly. ? Cannot tolerate heat. ? Have emotional swings. ? Have diarrhea. ? Feel weak. Get help right away if you have:  Chest pain.  An irregular heartbeat.  A rapid heartbeat.  Difficulty breathing. Summary  Hypothyroidism is when the thyroid gland does not make enough of certain hormones (it is underactive).  When the thyroid is underactive, it produces too little of the hormones thyroxine (T4) and triiodothyronine (T3).  The most common cause is Hashimoto's disease, a disease in which the body's disease-fighting system (immune system) attacks the thyroid gland. The condition can also be caused by viral infections, medicine, pregnancy, or past radiation treatment to the [name] or neck.  Symptoms may include weight gain, dry skin, constipation, feeling as though you do not have energy, and not being able to tolerate cold.  This condition is treated with medicine to replace the thyroid hormones that your body does not make. This information is not intended to replace advice given to you by your health care provider. Make sure you discuss any questions you have with your health care provider. Document Released: 06/22/2005 Document Revised: 06/04/2017 Document Reviewed: 06/02/2017 Elsevier Patient Education  2020 [employer].

## 2019-06-08 NOTE — Patient Instructions (Addendum)
Blood tests are requested for you today.  We'll let you know about the results.   Please come back for a follow-up appointment in January.        Hypothyroidism  Hypothyroidism is when the thyroid gland does not make enough of certain hormones (it is underactive). The thyroid gland is a small gland located in the lower front part of the neck, just in front of the windpipe (trachea). This gland makes hormones that help control how the body uses food for energy (metabolism) as well as how the heart and brain function. These hormones also play a role in keeping your bones strong. When the thyroid is underactive, it produces too little of the hormones thyroxine (T4) and triiodothyronine (T3). What are the causes? This condition may be caused by:  Hashimoto's disease. This is a disease in which the body's disease-fighting system (immune system) attacks the thyroid gland. This is the most common cause.  Viral infections.  Pregnancy.  Certain medicines.  Birth defects.  Past radiation treatments to the head or neck for cancer.  Past treatment with radioactive iodine.  Past exposure to radiation in the environment.  Past surgical removal of part or all of the thyroid.  Problems with a gland in the center of the brain (pituitary gland).  Lack of enough iodine in the diet. What increases the risk? You are more likely to develop this condition if:  You are female.  You have a family history of thyroid conditions.  You use a medicine called lithium.  You take medicines that affect the immune system (immunosuppressants). What are the signs or symptoms? Symptoms of this condition include:  Feeling as though you have no energy (lethargy).  Not being able to tolerate cold.  Weight gain that is not explained by a change in diet or exercise habits.  Lack of appetite.  Dry skin.  Coarse hair.  Menstrual irregularity.  Slowing of thought processes.  Constipation.  Sadness  or depression. How is this diagnosed? This condition may be diagnosed based on:  Your symptoms, your medical history, and a physical exam.  Blood tests. You may also have imaging tests, such as an ultrasound or MRI. How is this treated? This condition is treated with medicine that replaces the thyroid hormones that your body does not make. After you begin treatment, it may take several weeks for symptoms to go away. Follow these instructions at home:  Take over-the-counter and prescription medicines only as told by your health care provider.  If you start taking any new medicines, tell your health care provider.  Keep all follow-up visits as told by your health care provider. This is important. ? As your condition improves, your dosage of thyroid hormone medicine may change. ? You will need to have blood tests regularly so that your health care provider can monitor your condition. Contact a health care provider if:  Your symptoms do not get better with treatment.  You are taking thyroid replacement medicine and you: ? Sweat a lot. ? Have tremors. ? Feel anxious. ? Lose weight rapidly. ? Cannot tolerate heat. ? Have emotional swings. ? Have diarrhea. ? Feel weak. Get help right away if you have:  Chest pain.  An irregular heartbeat.  A rapid heartbeat.  Difficulty breathing. Summary  Hypothyroidism is when the thyroid gland does not make enough of certain hormones (it is underactive).  When the thyroid is underactive, it produces too little of the hormones thyroxine (T4) and triiodothyronine (T3).  The  most common cause is Hashimoto's disease, a disease in which the body's disease-fighting system (immune system) attacks the thyroid gland. The condition can also be caused by viral infections, medicine, pregnancy, or past radiation treatment to the head or neck.  Symptoms may include weight gain, dry skin, constipation, feeling as though you do not have energy, and not  being able to tolerate cold.  This condition is treated with medicine to replace the thyroid hormones that your body does not make. This information is not intended to replace advice given to you by your health care provider. Make sure you discuss any questions you have with your health care provider. Document Released: 06/22/2005 Document Revised: 06/04/2017 Document Reviewed: 06/02/2017 Elsevier Patient Education  2020 [employer].

## 2019-06-09 ENCOUNTER — Telehealth: Payer: Self-pay

## 2019-06-09 LAB — T4, FREE: Free T4: 1.03 ng/dL (ref 0.60–1.60)

## 2019-06-09 LAB — TSH: TSH: 1.32 u[IU]/mL (ref 0.35–4.50)

## 2019-06-09 NOTE — Telephone Encounter (Signed)
-----   Message from Renato Shin, MD sent at 06/09/2019  2:05 PM EST ----- please contact patient: Normal--good.  Therefore, you can wait 3 months for you next appt if you want to.

## 2019-06-09 NOTE — Telephone Encounter (Signed)
Lab results reviewed by Dr. Ellison. Letter has been mailed. For future reference, letter can be found in Epic. 

## 2019-06-14 ENCOUNTER — Other Ambulatory Visit: Payer: Self-pay | Admitting: Internal Medicine

## 2019-06-14 ENCOUNTER — Ambulatory Visit (INDEPENDENT_AMBULATORY_CARE_PROVIDER_SITE_OTHER): Payer: Medicare Other

## 2019-06-14 DIAGNOSIS — Z1159 Encounter for screening for other viral diseases: Secondary | ICD-10-CM

## 2019-06-15 LAB — SARS CORONAVIRUS 2 (TAT 6-24 HRS): SARS Coronavirus 2: NEGATIVE

## 2019-06-16 ENCOUNTER — Other Ambulatory Visit: Payer: Self-pay

## 2019-06-16 ENCOUNTER — Ambulatory Visit (AMBULATORY_SURGERY_CENTER): Payer: Medicare Other | Admitting: Internal Medicine

## 2019-06-16 ENCOUNTER — Encounter: Payer: Self-pay | Admitting: Internal Medicine

## 2019-06-16 VITALS — BP 111/67 | HR 63 | Temp 97.8°F | Resp 11 | Ht 63.0 in | Wt 185.0 lb

## 2019-06-16 DIAGNOSIS — K648 Other hemorrhoids: Secondary | ICD-10-CM

## 2019-06-16 DIAGNOSIS — D123 Benign neoplasm of transverse colon: Secondary | ICD-10-CM

## 2019-06-16 DIAGNOSIS — D509 Iron deficiency anemia, unspecified: Secondary | ICD-10-CM | POA: Diagnosis not present

## 2019-06-16 DIAGNOSIS — K635 Polyp of colon: Secondary | ICD-10-CM

## 2019-06-16 DIAGNOSIS — K5730 Diverticulosis of large intestine without perforation or abscess without bleeding: Secondary | ICD-10-CM

## 2019-06-16 MED ORDER — SODIUM CHLORIDE 0.9 % IV SOLN: 500.0000 mL | Freq: Once | INTRAVENOUS | Status: DC

## 2019-06-16 NOTE — Progress Notes (Signed)
[facility]  Temp-JB

## 2019-06-16 NOTE — Op Note (Signed)
[facility] Patient Name: Joan [name] Procedure Date: 06/16/2019 9:08 AM MRN: [id number] Endoscopist: Docia Chuck. Henrene Pastor , MD Age: 68 Referring MD:  Date of Birth: 1951/02/08 Gender: Female Account #: [id number] Procedure:                Colonoscopy with cold snare polypectomy x 1 Indications:              Microcytic anemia, progressive Medicines:                Monitored Anesthesia Care Procedure:                Pre-Anesthesia Assessment:                           - Prior to the procedure, a History and Physical                            was performed, and patient medications and                            allergies were reviewed. The patient's tolerance of                            previous anesthesia was also reviewed. The risks                            and benefits of the procedure and the sedation                            options and risks were discussed with the patient.                            All questions were answered, and informed consent                            was obtained. Prior Anticoagulants: The patient has                            taken no previous anticoagulant or antiplatelet                            agents. ASA Grade Assessment: II - A patient with                            mild systemic disease. After reviewing the risks                            and benefits, the patient was deemed in                            satisfactory condition to undergo the procedure.                           After obtaining informed consent, the colonoscope  was passed under direct vision. Throughout the                            procedure, the patient's blood pressure, pulse, and                            oxygen saturations were monitored continuously. The                            Colonoscope was introduced through the anus and                            advanced to the the cecum, identified by   appendiceal orifice and ileocecal valve. The                            ileocecal valve, appendiceal orifice, and rectum                            were photographed. The quality of the bowel                            preparation was excellent. The colonoscopy was                            performed without difficulty. The patient tolerated                            the procedure well. The bowel preparation used was                            SUPREP via split dose instruction. Scope In: 9:23:42 AM Scope Out: 9:40:33 AM Scope Withdrawal Time: 0 hours 11 minutes 39 seconds  Total Procedure Duration: 0 hours 16 minutes 51 seconds  Findings:                 A 1 mm polyp was found in the transverse colon. The                            polyp was sessile. The polyp was removed with a                            cold snare. Resection and retrieval were complete.                           Multiple diverticula were found in the sigmoid                            colon and right colon.                           Internal hemorrhoids were found during retroflexion.                           The exam  was otherwise without abnormality on                            direct and retroflexion views. Complications:            No immediate complications. Estimated blood loss:                            None. Estimated Blood Loss:     Estimated blood loss: none. Impression:               - One 1 mm polyp in the transverse colon, removed                            with a cold snare. Resected and retrieved.                           - Diverticulosis in the sigmoid colon and in the                            right colon.                           - Internal hemorrhoids.                           - The examination was otherwise normal on direct                            and retroflexion views. Recommendation:           - Repeat colonoscopy is not recommended for                            surveillance.                            - Patient has a contact number available for                            emergencies. The signs and symptoms of potential                            delayed complications were discussed with the                            patient. Return to normal activities tomorrow.                            Written discharge instructions were provided to the                            patient.                           - Resume previous diet.                           -  Continue present medications.                           - Await pathology results. Docia Chuck. Henrene Pastor, MD 06/16/2019 9:43:42 AM This report has been signed electronically.

## 2019-06-16 NOTE — Progress Notes (Signed)
Called to room to assist during endoscopic procedure.  Patient ID and intended procedure confirmed with present staff. Received instructions for my participation in the procedure from the performing physician.  

## 2019-06-16 NOTE — Progress Notes (Signed)
Temp JB VS [place]  Pt's states no medical or surgical changes since previsit or office visit.

## 2019-06-16 NOTE — Op Note (Signed)
[facility] Patient Name: Joan [name] Procedure Date: 06/16/2019 9:08 AM MRN: [id number] Endoscopist: Docia Chuck. Henrene Pastor , MD Age: 68 Referring MD:  Date of Birth: [birth year] Gender: Female Account #: [id number] Procedure:                Upper GI endoscopy Indications:              Microcytic anemia, progressive Medicines:                Monitored Anesthesia Care Procedure:                Pre-Anesthesia Assessment:                           - Prior to the procedure, a History and Physical                            was performed, and patient medications and                            allergies were reviewed. The patient's tolerance of                            previous anesthesia was also reviewed. The risks                            and benefits of the procedure and the sedation                            options and risks were discussed with the patient.                            All questions were answered, and informed consent                            was obtained. Prior Anticoagulants: The patient has                            taken no previous anticoagulant or antiplatelet                            agents. ASA Grade Assessment: II - A patient with                            mild systemic disease. After reviewing the risks                            and benefits, the patient was deemed in                            satisfactory condition to undergo the procedure.                           After obtaining informed consent, the endoscope was  passed under direct vision. Throughout the                            procedure, the patient's blood pressure, pulse, and                            oxygen saturations were monitored continuously. The                            Endoscope was introduced through the mouth, and                            advanced to the second part of duodenum. The upper                            GI endoscopy was  accomplished without difficulty.                            The patient tolerated the procedure well. Scope In: Scope Out: Findings:                 The esophagus was normal.                           The stomach was normal.                           The examined duodenum was normal.                           The cardia and gastric fundus were normal on                            retroflexion. Complications:            No immediate complications. Estimated Blood Loss:     Estimated blood loss: none. Impression:               - Normal esophagus.                           - Normal stomach.                           - Normal examined duodenum.                           - No specimens collected. Recommendation:           - Patient has a contact number available for                            emergencies. The signs and symptoms of potential                            delayed complications were discussed with the  patient. Return to normal activities tomorrow.                            Written discharge instructions were provided to the                            patient.                           - Resume previous diet.                           - Continue present medications. Docia Chuck. Henrene Pastor, MD 06/16/2019 9:51:30 AM This report has been signed electronically.

## 2019-06-16 NOTE — Patient Instructions (Signed)
Impression/Recommendations:  Polyp handout given to patient. Diverticulosis handout given to patient. Hemorrhoid handout given to patient.  Repeat colonoscopy not recommended for surveillance.  Resume previous diet. Continue present medications. Await pathology results.  YOU HAD AN ENDOSCOPIC PROCEDURE TODAY AT [facility]:   Refer to the procedure report that was given to you for any specific questions about what was found during the examination.  If the procedure report does not answer your questions, please call your gastroenterologist to clarify.  If you requested that your care partner not be given the details of your procedure findings, then the procedure report has been included in a sealed envelope for you to review at your convenience later.  YOU SHOULD EXPECT: Some feelings of bloating in the abdomen. Passage of more gas than usual.  Walking can help get rid of the air that was put into your GI tract during the procedure and reduce the bloating. If you had a lower endoscopy (such as a colonoscopy or flexible sigmoidoscopy) you may notice spotting of blood in your stool or on the toilet paper. If you underwent a bowel prep for your procedure, you may not have a normal bowel movement for a few days.  Please Note:  You might notice some irritation and congestion in your nose or some drainage.  This is from the oxygen used during your procedure.  There is no need for concern and it should clear up in a day or so.  SYMPTOMS TO REPORT IMMEDIATELY:   Following lower endoscopy (colonoscopy or flexible sigmoidoscopy):  Excessive amounts of blood in the stool  Significant tenderness or worsening of abdominal pains  Swelling of the abdomen that is new, acute  Fever of 100F or higher   Following upper endoscopy (EGD)  Vomiting of blood or coffee ground material  New chest pain or pain under the shoulder blades  Painful or persistently difficult swallowing  New  shortness of breath  Fever of 100F or higher  Black, tarry-looking stools  For urgent or emergent issues, a gastroenterologist can be reached at any hour by calling [phone].   DIET:  We do recommend a small meal at first, but then you may proceed to your regular diet.  Drink plenty of fluids but you should avoid alcoholic beverages for 24 hours.  ACTIVITY:  You should plan to take it easy for the rest of today and you should NOT DRIVE or use heavy machinery until tomorrow (because of the sedation medicines used during the test).    FOLLOW UP: Our staff will call the number listed on your records 48-72 hours following your procedure to check on you and address any questions or concerns that you may have regarding the information given to you following your procedure. If we do not reach you, we will leave a message.  We will attempt to reach you two times.  During this call, we will ask if you have developed any symptoms of COVID 19. If you develop any symptoms (ie: fever, flu-like symptoms, shortness of breath, cough etc.) before then, please call [phone].  If you test positive for Covid 19 in the 2 weeks post procedure, please call and report this information to [place].    If any biopsies were taken you will be contacted by phone or by letter within the next 1-3 weeks.  Please call us at [phone] if you have not heard about the biopsies in 3 weeks.    SIGNATURES/CONFIDENTIALITY: You and/or your care  partner have signed paperwork which will be entered into your electronic medical record.  These signatures attest to the fact that that the information above on your After Visit Summary has been reviewed and is understood.  Full responsibility of the confidentiality of this discharge information lies with you and/or your care-partner.

## 2019-06-16 NOTE — Progress Notes (Signed)
A and O x3. Report to RN. Tolerated MAC anesthesia well.Teeth unchanged after procedure.

## 2019-06-20 ENCOUNTER — Telehealth: Payer: Self-pay

## 2019-06-20 ENCOUNTER — Encounter: Payer: Self-pay | Admitting: Internal Medicine

## 2019-06-20 NOTE — Telephone Encounter (Signed)
Called [phone] and unable to leave a  message we tried to reach pt for a follow up call. Phone rang a number of times.  Then phone automatically disconnected. maw

## 2019-06-20 NOTE — Telephone Encounter (Signed)
  Follow up Call-  Call back number 06/16/2019  Post procedure Call Back phone  # [phone]  Permission to leave phone message Yes  Some recent data might be hidden     Patient questions:  Do you have a fever, pain , or abdominal swelling? No. Pain Score  0 *  Have you tolerated food without any problems? Yes.    Have you been able to return to your normal activities? Yes.    Do you have any questions about your discharge instructions: Diet   No. Medications  No. Follow up visit  No.  Do you have questions or concerns about your Care? No.  Actions: * If pain score is 4 or above: No action needed, pain <4.  1. Have you developed a fever since your procedure? no  2.   Have you had an respiratory symptoms (SOB or cough) since your procedure? no  3.   Have you tested positive for COVID 19 since your procedure no  4.   Have you had any family members/close contacts diagnosed with the COVID 19 since your procedure?  no   If yes to any of these questions please route to Joylene John, RN and Alphonsa Gin, Therapist, sports.

## 2019-06-23 ENCOUNTER — Telehealth: Payer: Self-pay | Admitting: Family Medicine

## 2019-06-23 ENCOUNTER — Ambulatory Visit (INDEPENDENT_AMBULATORY_CARE_PROVIDER_SITE_OTHER): Payer: Medicare Other | Admitting: Family Medicine

## 2019-06-23 ENCOUNTER — Encounter: Payer: Self-pay | Admitting: Family Medicine

## 2019-06-23 ENCOUNTER — Telehealth: Payer: Self-pay | Admitting: Internal Medicine

## 2019-06-23 VITALS — Ht 63.0 in | Wt 185.0 lb

## 2019-06-23 DIAGNOSIS — Z20828 Contact with and (suspected) exposure to other viral communicable diseases: Secondary | ICD-10-CM

## 2019-06-23 DIAGNOSIS — Z20822 Contact with and (suspected) exposure to covid-19: Secondary | ICD-10-CM

## 2019-06-23 NOTE — Telephone Encounter (Signed)
Tried calling pt in reference to [facility] appt at 4pm today. With cold symptoms and loss of taste/smell, pt needs covid testing. She should text "COVID" to [phone] or call [phone] to schedule appt for testing at [facility]. appt can also be scheduled online at [email or web address] She should self-quarantine until she has the results of the test. Supportive care - rest, increased fluids, tylenol/motrin as needed, etc - is recommended.

## 2019-06-23 NOTE — Telephone Encounter (Signed)
Discussed with pt that the symptoms she describe sound very suspicious for covid 19. Pt states she is feeling better but encouraged pt to call her PCP office. Pt verbalized understanding.

## 2019-06-23 NOTE — Telephone Encounter (Signed)
Pt stated that she had an EGD/col 06/16/19.  She reported that she has been experiencing headaches, chills, fatigue, loss of taste and sinus problems.  She has not had a fever.  Pt stated that today she is feeling better.   She would like advice on what to do--she said that she is currently at school and does not have a return number.  She will try to call back in 1-2 hours and hopefully speak to a nurse.

## 2019-06-23 NOTE — Progress Notes (Signed)
Virtual Visit via Telephone Note  I connected with Stan Head on 06/23/19 at  4:00 PM EST by telephone and verified that I am speaking with the correct person using two identifiers.   I discussed the limitations, risks, security and privacy concerns of performing an evaluation and management service by telephone and the availability of in person appointments. I also discussed with the patient that there may be a patient responsible charge related to this service. The patient expressed understanding and agreed to proceed.  Location patient: home Location provider: work  Participants present for the call: patient, provider Patient did not have a visit in the prior 7 days to address this/these issue(s).  Chief Complaint  Patient presents with  . URI    Lost of taste and smell.  Pt had a covid test on Wednesday.  Pt did have the procedure done on Friday.     History of Present Illness: Joan [name] is a 68 y.o. female who complains of terrible headache on Sun 12/14, followed by a few days of nasal congestion, sinus pressure. These symptoms have improved but now pt feels very fatigued and endorses a loss of taste and smell. No fever, chills, myalgias, GI symptoms.  She had a negative COVID test on 06/14/19 ahead of her planned EGD/colo. The procedures were done 1 week ago on 06/16/19.  Pt has not taken any meds for symptoms. No known sick contacts.   Past Medical History:  Diagnosis Date  . Allergic rhinitis   . Anemia   . Hyperlipidemia    Pt denies  . Hypertension    Pt denies  . Thyroid disease     Past Surgical History:  Procedure Laterality Date  . COLONOSCOPY    . SPINE SURGERY    . UPPER GASTROINTESTINAL ENDOSCOPY      Social History   Tobacco Use  . Smoking status: Never Smoker  . Smokeless tobacco: Never Used  Substance Use Topics  . Alcohol use: No  . Drug use: No    Family History  Problem Relation Age of Onset  . Heart disease Mother   .  Diabetes Sister   . Thyroid disease Neg Hx   . Colon cancer Neg Hx   . Esophageal cancer Neg Hx   . Rectal cancer Neg Hx   . Stomach cancer Neg Hx     Outpatient Encounter Medications as of 06/23/2019  Medication Sig  . cetirizine (ZYRTEC) 10 MG tablet Take 10 mg by mouth daily.  . Cholecalciferol (DIALYVITE VITAMIN D 5000 PO) Take 1 capsule by mouth daily.  . hydrochlorothiazide (MICROZIDE) 12.5 MG capsule Take 12.5 mg by mouth daily.  Marland Kitchen levothyroxine (SYNTHROID) 75 MCG tablet Take 1 tablet (75 mcg total) by mouth daily.   No facility-administered encounter medications on file as of 06/23/2019.     Allergies  Allergen Reactions  . Penicillins Hives      ROS: See pertinent positives and negatives per HPI.   Observations/Objective: Patient sounds cheerful and well on the phone. I do not appreciate any SOB. Speech and thought processing are grossly intact. Patient reported vitals:  Ht 5\' 3"  (1.6 m)   Wt 185 lb (83.9 kg)   BMI 32.77 kg/m    Assessment and Plan:  1. Suspected COVID-19 virus infection - symptoms sounds very much like COVID-19 infection and recommended pt self-quarantine this weekend and schedule a test at [facility] on Monday. Pt prefers to call to do this so I gave her the phone number  to call and schedule - she was planning to travel to the beach this weekend but understands this is not advisable and will cancel her hotel - advised supportive care and f/u PRN   I did not refer this patient for an OV in the next 24 hours for this/these issue(s).  I discussed the assessment and treatment plan with the patient. The patient was provided an opportunity to ask questions and all were answered. The patient agreed with the plan and demonstrated an understanding of the instructions.   The patient was advised to call back or seek an in-person evaluation if the symptoms worsen or if the condition fails to improve as anticipated.  I provided 13 minutes of non-face-to-face  time during this encounter.   Letta Median, DO

## 2019-06-26 ENCOUNTER — Ambulatory Visit: Payer: Medicare Other | Attending: Internal Medicine

## 2019-06-26 DIAGNOSIS — Z20822 Contact with and (suspected) exposure to covid-19: Secondary | ICD-10-CM

## 2019-06-27 LAB — NOVEL CORONAVIRUS, NAA: SARS-CoV-2, NAA: DETECTED — AB

## 2019-06-28 ENCOUNTER — Telehealth: Payer: Self-pay | Admitting: Unknown Physician Specialty

## 2019-06-28 ENCOUNTER — Telehealth: Payer: Self-pay | Admitting: Behavioral Health

## 2019-06-28 NOTE — Telephone Encounter (Signed)
Called to discuss with patient about Covid symptoms and the use of bamlanivimab, a monoclonal antibody infusion for those with mild to moderate Covid symptoms and at a high risk of hospitalization.  Pt is qualified for this infusion at the [facility] due to Age > 65   Message left to call back  

## 2019-06-28 NOTE — Telephone Encounter (Signed)
Erroneous phone encounter

## 2019-06-28 NOTE — Telephone Encounter (Signed)
Copied from [facility] [id number]. Topic: General - Other >> Jun 28, 2019 10:35 AM Celene Kras wrote: Reason for CRM: Pt called and is requesting a call back from PCP or nurse regarding her positive covid test. Please advise.

## 2019-06-28 NOTE — Telephone Encounter (Signed)
Called number listed in chart and got voicemail.  Name on VM is Quita Skye.  Not listed in patient contacts so no message left.

## 2019-07-05 ENCOUNTER — Encounter: Payer: Self-pay | Admitting: Family Medicine

## 2019-07-14 ENCOUNTER — Ambulatory Visit (INDEPENDENT_AMBULATORY_CARE_PROVIDER_SITE_OTHER): Payer: Medicare Other | Admitting: Endocrinology

## 2019-07-14 ENCOUNTER — Other Ambulatory Visit: Payer: Self-pay

## 2019-07-14 ENCOUNTER — Encounter: Payer: Self-pay | Admitting: Endocrinology

## 2019-07-14 DIAGNOSIS — E119 Type 2 diabetes mellitus without complications: Secondary | ICD-10-CM | POA: Diagnosis not present

## 2019-07-14 DIAGNOSIS — E039 Hypothyroidism, unspecified: Secondary | ICD-10-CM | POA: Diagnosis not present

## 2019-07-14 NOTE — Patient Instructions (Addendum)
Please continue the same levothyroxine.  Please come back for a follow-up appointment in 3 months.

## 2019-07-14 NOTE — Progress Notes (Signed)
Subjective:    Patient ID: Joan [name], female    DOB: 05/21/1951, 69 y.o.   MRN: [id number]  HPI telehealth visit today via doxy video visit.  Alternatives to telehealth are presented to this patient, and the patient agrees to the telehealth visit.   Pt is advised of the cost of the visit, and agrees to this, also.   Patient is in her car, and I am at the office.   Persons attending the telehealth visit: the patient and I.  Pt returns for f/u of hypothyroidism (dx'ed 2010; she was seen at [facility] for this until 2020; she has never had thyroid imaging).  pt states she feels well in general.  Specifically, she is recovering from coronavirus.  She takes synthroid as rx'ed.   Past Medical History:  Diagnosis Date  . Allergic rhinitis   . Anemia   . Hyperlipidemia    Pt denies  . Hypertension    Pt denies  . Thyroid disease     Past Surgical History:  Procedure Laterality Date  . COLONOSCOPY    . SPINE SURGERY    . UPPER GASTROINTESTINAL ENDOSCOPY      Social History   Socioeconomic History  . Marital status: Married    Spouse name: Not on file  . Number of children: 0  . Years of education: Not on file  . Highest education level: Master's degree (e.g., MA, MS, MEng, MEd, MSW, MBA)  Occupational History    Employer: [employer]  Tobacco Use  . Smoking status: Never Smoker  . Smokeless tobacco: Never Used  Substance and Sexual Activity  . Alcohol use: No  . Drug use: No  . Sexual activity: Yes    Partners: Male    Birth control/protection: None  Other Topics Concern  . 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 Married, no children. Adopted at 50 y/o - difficult years.    Social Determinants of Health   Financial Resource Strain: Low Risk   . Difficulty of Paying Living Expenses: Not very hard  Food Insecurity: No Food Insecurity  . Worried  About Charity fundraiser in the Last Year: Never true  . Ran Out of Food in the Last Year: Never true  Transportation Needs: No Transportation Needs  . Lack of Transportation (Medical): No  . Lack of Transportation (Non-Medical): No  Physical Activity: Insufficiently Active  . Days of Exercise per Week: 2 days  . Minutes of Exercise per Session: 30 min  Stress: No Stress Concern Present  . Feeling of Stress : Not at all  Social Connections: Not Isolated  . Frequency of Communication with Friends and Family: More than three times a week  . Frequency of Social Gatherings with Friends and Family: Twice a week  . Attends Religious Services: More than 4 times per year  . Active Member of Clubs or Organizations: Yes  . Attends Archivist Meetings: More than 4 times per year  . Marital Status: Married  Human resources officer Violence: Not At Risk  . Fear of Current or Ex-Partner: No  . Emotionally Abused: No  . Physically Abused: No  . Sexually Abused: No    Current Outpatient Medications on File Prior to Visit  Medication Sig Dispense Refill  . cetirizine (ZYRTEC) 10 MG tablet Take 10 mg by mouth daily.    . Cholecalciferol (DIALYVITE VITAMIN D  5000 PO) Take 1 capsule by mouth daily.    . hydrochlorothiazide (MICROZIDE) 12.5 MG capsule Take 12.5 mg by mouth daily.    Marland Kitchen levothyroxine (SYNTHROID) 75 MCG tablet Take 1 tablet (75 mcg total) by mouth daily. 90 tablet 3   No current facility-administered medications on file prior to visit.    Allergies  Allergen Reactions  . Penicillins Hives    Family History  Problem Relation Age of Onset  . Heart disease Mother   . Diabetes Sister   . Thyroid disease Neg Hx   . Colon cancer Neg Hx   . Esophageal cancer Neg Hx   . Rectal cancer Neg Hx   . Stomach cancer Neg Hx     There were no vitals taken for this visit.   Review of Systems No weight change    Objective:   Physical Exam  Lab Results  Component Value Date   TSH  1.32 06/08/2019       Assessment & Plan:  Hypothyroidism: well-replaced.  Coronavirus: clinically better  Patient Instructions  Please continue the same levothyroxine.  Please come back for a follow-up appointment in 3 months.

## 2019-07-18 ENCOUNTER — Telehealth: Payer: Self-pay | Admitting: Family Medicine

## 2019-07-18 ENCOUNTER — Other Ambulatory Visit: Payer: Self-pay

## 2019-07-18 MED ORDER — DIALYVITE VITAMIN D 5000 125 MCG (5000 UT) PO CAPS: 5000.0000 [IU] | ORAL_CAPSULE | Freq: Every day | ORAL | 3 refills | Status: AC

## 2019-07-18 MED ORDER — LEVOTHYROXINE SODIUM 75 MCG PO TABS: 75.0000 ug | ORAL_TABLET | Freq: Every day | ORAL | 3 refills | Status: AC

## 2019-07-18 NOTE — Telephone Encounter (Signed)
I printed off rx's and will have Dr. Loletha Grayer sign so I can fax the over to Dr. Enrique Sack office, as Dr. Loletha Grayer is out of office until Thursday.

## 2019-07-18 NOTE — Telephone Encounter (Signed)
Patient is calling and requesting for a prescription for D3 2000 units and Levothroxine 75mg  be sent to the [place] office @ [phone] due to cost. Patient stated that medication would be cheaper through the [place]. Also patient stated that a diagnosis needs to be sent why medication was prescribed. CB is [phone]

## 2019-07-19 NOTE — Telephone Encounter (Signed)
Will sign Rx tomorrow when I am back in the office

## 2019-07-20 NOTE — Telephone Encounter (Signed)
Both Rx signed and ready to be faxed

## 2019-07-20 NOTE — Telephone Encounter (Signed)
Rx faxed to # provided in previous message. Pt was called and made aware that this was faxed.

## 2019-09-03 ENCOUNTER — Ambulatory Visit: Payer: Medicare Other

## 2020-01-23 ENCOUNTER — Ambulatory Visit (INDEPENDENT_AMBULATORY_CARE_PROVIDER_SITE_OTHER): Payer: Medicare Other | Admitting: Family Medicine

## 2020-02-06 ENCOUNTER — Ambulatory Visit (INDEPENDENT_AMBULATORY_CARE_PROVIDER_SITE_OTHER): Payer: Medicare Other | Admitting: Family Medicine

## 2023-04-28 ENCOUNTER — Ambulatory Visit: Payer: Medicare PPO

## 2023-04-28 ENCOUNTER — Ambulatory Visit
Admission: EM | Admit: 2023-04-28 | Discharge: 2023-04-28 | Disposition: A | Discharge status: Home / Self Care | Payer: Medicare PPO | Attending: Internal Medicine | Admitting: Internal Medicine

## 2023-04-28 DIAGNOSIS — M25562 Pain in left knee: Secondary | ICD-10-CM

## 2023-04-28 NOTE — Discharge Instructions (Signed)
Brace has been applied.  Recommend you take this off to sleep.  Apply ice.  Take over-the-counter pain relievers as we discussed.  Follow-up with orthopedist if pain persists or worsens.

## 2023-04-28 NOTE — ED Provider Notes (Signed)
[facility]    CSN: [id number] Arrival date & time: 04/28/23  1513      History   Chief Complaint No chief complaint on file.   HPI Joan [name] is a 72 y.o. female.   Patient presents with left knee pain that started today.  Reports that approximately 3 nights ago, she heard her knee pop while in bed.  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  Denies any obvious injury.  Denies history of chronic knee pain.  Denies numbness or tingling.  Reports difficulty bearing weight.  She has not taken any medication for pain.     Past Medical History:  Diagnosis Date   Allergic rhinitis    Anemia    Hyperlipidemia    Pt denies   Hypertension    Pt denies   Thyroid disease     Patient Active Problem List   Diagnosis Date Noted   Microcytic anemia 04/26/2019   Obesity (BMI 30-39.9) 01/26/2019   Diabetes mellitus (HCC) 01/25/2019   HTN (hypertension), Rx HCTZ 12/30/2012   Pure hypercholesterolemia 12/30/2012   Hypothyroidism, Rx Levothyroxine 12/30/2012    Past Surgical History:  Procedure Laterality Date   COLONOSCOPY     SPINE SURGERY     UPPER GASTROINTESTINAL ENDOSCOPY      OB History   No obstetric history on file.      Home Medications    Prior to Admission medications   Medication Sig Start Date End Date Taking? Authorizing Provider  cetirizine (ZYRTEC) 10 MG tablet Take 10 mg by mouth daily.    [provider]  Cholecalciferol (DIALYVITE VITAMIN D 5000) 125 MCG (5000 UT) capsule Take 1 capsule (5,000 Units total) by mouth daily. 07/18/19   Cirigliano, Jearld Lesch, DO  hydrochlorothiazide (MICROZIDE) 12.5 MG capsule Take 12.5 mg by mouth daily.    [provider]  levothyroxine (SYNTHROID) 75 MCG tablet Take 1 tablet (75 mcg total) by mouth daily. 07/18/19   CiriglianoJearld Lesch, DO    Family History Family History  Problem Relation Age of Onset   Heart disease Mother     Diabetes Sister    Thyroid disease Neg Hx    Colon cancer Neg Hx    Esophageal cancer Neg Hx    Rectal cancer Neg Hx    Stomach cancer Neg Hx     Social History Social History   Tobacco Use   Smoking status: Never   Smokeless tobacco: Never  Vaping Use   Vaping status: Never Used  Substance Use Topics   Alcohol use: No   Drug use: No     Allergies   Penicillins   Review of Systems Review of Systems Per HPI  Physical Exam Triage Vital Signs ED Triage Vitals  Encounter Vitals Group     BP 04/28/23 1553 131/77     Systolic BP Percentile --      Diastolic BP Percentile --      Pulse Rate 04/28/23 1553 80     Resp 04/28/23 1553 17     Temp 04/28/23 1553 98.6 F (37 C)     Temp Source 04/28/23 1553 Oral     SpO2 04/28/23 1553 97 %     Weight 04/28/23 1552 175 lb (79.4 kg)     Height 04/28/23 1552 5\' 3"  (1.6 m)     Head Circumference --      Peak Flow --  Pain Score 04/28/23 1551 10     Pain Loc --      Pain Education --      Exclude from Growth Chart --    No data found.  Updated Vital Signs BP 131/77 (BP Location: Right Arm)   Pulse 80   Temp 98.6 F (37 C) (Oral)   Resp 17   Ht 5\' 3"  (1.6 m)   Wt 175 lb (79.4 kg)   SpO2 97%   BMI 31.00 kg/m   Visual Acuity Right Eye Distance:   Left Eye Distance:   Bilateral Distance:    Right Eye Near:   Left Eye Near:    Bilateral Near:     Physical Exam Constitutional:      General: She is not in acute distress.    Appearance: Normal appearance. She is not toxic-appearing or diaphoretic.  HENT:     Head: Normocephalic and atraumatic.  Eyes:     Extraocular Movements: Extraocular movements intact.     Conjunctiva/sclera: Conjunctivae normal.  Pulmonary:     Effort: Pulmonary effort is normal.  Musculoskeletal:     Left knee: No swelling. Tenderness present. No LCL laxity, MCL laxity, ACL laxity or PCL laxity.Normal pulse.     Comments: Tenderness to palpation to anterior lower left knee.  No  significant swelling, discoloration, lacerations, abrasions noted.  No crepitus noted.  Appears to be neurovascularly intact.  Neurological:     General: No focal deficit present.     Mental Status: She is alert and oriented to person, place, and time. Mental status is at baseline.  Psychiatric:        Mood and Affect: Mood normal.        Behavior: Behavior normal.        Thought Content: Thought content normal.        Judgment: Judgment normal.      UC Treatments / Results  Labs (all labs ordered are listed, but only abnormal results are displayed) Labs Reviewed - No data to display  EKG   Radiology DG Knee Complete 4 Views Left  Result Date: 04/28/2023 CLINICAL DATA:  Knee pain EXAM: LEFT KNEE - COMPLETE 4+ VIEW COMPARISON:  None Available. FINDINGS: No evidence of fracture, dislocation, or joint effusion. Joint spaces are maintained. There are mild degenerative changes of the knee. Soft tissues are unremarkable. IMPRESSION: 1. No acute fracture or dislocation. 2. Mild degenerative changes. Electronically Signed   By: Darliss Cheney M.D.   On: 04/28/2023 19:19    Procedures Procedures (including critical care time)  Medications Ordered in UC Medications - No data to display  Initial Impression / Assessment and Plan / UC Course  I have reviewed the triage vital signs and the nursing notes.  Pertinent labs & imaging results that were available during my care of the patient were reviewed by me and considered in my medical decision making (see chart for details).     X-ray of knee is negative for any acute bony abnormality.  It did show degenerative changes of the knee which could be contributing to pain but I am not suspicious of muscular strain/injury as well.  Patient was placed in a knee brace prior to discharge to help with support and stability.  She was having difficulty bearing weight due to pain so she was provided with crutches as well.  Advised supportive care  including safe over-the-counter pain relievers, ice application, rest.  Advised to follow-up with orthopedist for further evaluation and management.  [facility].  Patient verbalized understanding and was agreeable with plan.  Attempted to call patient to notify of x-ray results but there was no answer. Final Clinical Impressions(s) / UC Diagnoses   Final diagnoses:  Acute pain of left knee     Discharge Instructions      Brace has been applied.  Recommend you take this off to sleep.  Apply ice.  Take over-the-counter pain relievers as we discussed.  Follow-up with orthopedist if pain persists or worsens.    ED Prescriptions   None    PDMP not reviewed this encounter.   Gustavus Bryant, [place] 04/29/23 [phone]

## 2023-04-28 NOTE — ED Triage Notes (Signed)
Patient presents with left knee pain, states 3 nights ago she heard her knee pop and started hurting bad around 3:30pm today. No treatment used.
